# Patient Record
Sex: Female | Born: 1965 | Race: White | Hispanic: No | Marital: Married | Smoking: Former smoker
Health system: Southern US, Community
[De-identification: ages and names within clinical notes are randomized; demographics above are authoritative.]

---

## 2013-09-13 ENCOUNTER — Other Ambulatory Visit (HOSPITAL_COMMUNITY): Payer: Self-pay | Admitting: Family Medicine

## 2013-09-13 DIAGNOSIS — Z139 Encounter for screening, unspecified: Secondary | ICD-10-CM

## 2013-09-19 ENCOUNTER — Ambulatory Visit (HOSPITAL_COMMUNITY)
Admission: RE | Admit: 2013-09-19 | Discharge: 2013-09-19 | Disposition: A | Discharge status: Home / Self Care | Payer: Managed Care, Other (non HMO) | Source: Ambulatory Visit | Attending: Family Medicine | Admitting: Family Medicine

## 2013-09-19 DIAGNOSIS — Z139 Encounter for screening, unspecified: Secondary | ICD-10-CM

## 2013-09-19 DIAGNOSIS — Z1231 Encounter for screening mammogram for malignant neoplasm of breast: Secondary | ICD-10-CM | POA: Insufficient documentation

## 2015-10-22 ENCOUNTER — Other Ambulatory Visit (HOSPITAL_COMMUNITY): Payer: Self-pay | Admitting: Unknown Physician Specialty

## 2015-10-22 DIAGNOSIS — Z1231 Encounter for screening mammogram for malignant neoplasm of breast: Secondary | ICD-10-CM

## 2015-10-28 ENCOUNTER — Ambulatory Visit (HOSPITAL_COMMUNITY)
Admission: RE | Admit: 2015-10-28 | Discharge: 2015-10-28 | Disposition: A | Discharge status: Home / Self Care | Payer: Managed Care, Other (non HMO) | Source: Ambulatory Visit | Attending: Unknown Physician Specialty | Admitting: Unknown Physician Specialty

## 2015-10-28 DIAGNOSIS — Z1231 Encounter for screening mammogram for malignant neoplasm of breast: Secondary | ICD-10-CM | POA: Diagnosis not present

## 2017-01-14 ENCOUNTER — Other Ambulatory Visit (HOSPITAL_COMMUNITY): Payer: Self-pay | Admitting: Family Medicine

## 2017-01-14 DIAGNOSIS — Z1231 Encounter for screening mammogram for malignant neoplasm of breast: Secondary | ICD-10-CM

## 2017-01-21 ENCOUNTER — Ambulatory Visit (HOSPITAL_COMMUNITY)
Admission: RE | Admit: 2017-01-21 | Discharge: 2017-01-21 | Disposition: A | Discharge status: Home / Self Care | Payer: Managed Care, Other (non HMO) | Source: Ambulatory Visit | Attending: Family Medicine | Admitting: Family Medicine

## 2017-01-21 DIAGNOSIS — Z1231 Encounter for screening mammogram for malignant neoplasm of breast: Secondary | ICD-10-CM | POA: Insufficient documentation

## 2017-02-04 ENCOUNTER — Encounter (INDEPENDENT_AMBULATORY_CARE_PROVIDER_SITE_OTHER): Payer: Self-pay | Admitting: *Deleted

## 2017-02-19 ENCOUNTER — Other Ambulatory Visit (INDEPENDENT_AMBULATORY_CARE_PROVIDER_SITE_OTHER): Payer: Self-pay | Admitting: *Deleted

## 2017-02-19 DIAGNOSIS — Z1211 Encounter for screening for malignant neoplasm of colon: Secondary | ICD-10-CM | POA: Insufficient documentation

## 2017-05-11 ENCOUNTER — Encounter (INDEPENDENT_AMBULATORY_CARE_PROVIDER_SITE_OTHER): Payer: Self-pay | Admitting: *Deleted

## 2017-05-11 ENCOUNTER — Telehealth (INDEPENDENT_AMBULATORY_CARE_PROVIDER_SITE_OTHER): Payer: Self-pay | Admitting: *Deleted

## 2017-05-11 MED ORDER — PEG 3350-KCL-NA BICARB-NACL 420 G PO SOLR: 4000.0000 mL | Freq: Once | ORAL | 0 refills | Status: AC

## 2017-05-11 NOTE — Telephone Encounter (Signed)
Patient needs trilyte 

## 2017-05-18 ENCOUNTER — Telehealth (INDEPENDENT_AMBULATORY_CARE_PROVIDER_SITE_OTHER): Payer: Self-pay | Admitting: *Deleted

## 2017-05-18 NOTE — Telephone Encounter (Signed)
agree

## 2017-05-18 NOTE — Telephone Encounter (Signed)
Referring MD/PCP: burdine   Procedure: tcs  Reason/Indication:  screening  Has patient had this procedure before?  no  If so, when, by whom and where?    Is there a family history of colon cancer?  no  Who?  What age when diagnosed?    Is patient diabetic?   no      Does patient have prosthetic heart valve or mechanical valve?  no  Do you have a pacemaker?  no  Has patient ever had endocarditis? no  Has patient had joint replacement within last 12 months?  no  Does patient tend to be constipated or take laxatives? no  Does patient have a history of alcohol/drug use?  no  Is patient on Coumadin, Plavix and/or Aspirin? no  Medications: none  Allergies: nkda  Medication Adjustment per Dr Karilyn Cotaehman:   Procedure date & time: 06/17/17 at 830

## 2017-06-17 ENCOUNTER — Ambulatory Visit (HOSPITAL_COMMUNITY)
Admission: RE | Admit: 2017-06-17 | Discharge: 2017-06-17 | Disposition: A | Discharge status: Home / Self Care | Payer: Managed Care, Other (non HMO) | Source: Ambulatory Visit | Attending: Internal Medicine | Admitting: Internal Medicine

## 2017-06-17 ENCOUNTER — Encounter (HOSPITAL_COMMUNITY): Payer: Self-pay | Admitting: *Deleted

## 2017-06-17 ENCOUNTER — Encounter (HOSPITAL_COMMUNITY)
Admission: RE | Disposition: A | Discharge status: Home / Self Care | Payer: Self-pay | Source: Ambulatory Visit | Attending: Internal Medicine

## 2017-06-17 DIAGNOSIS — Z79899 Other long term (current) drug therapy: Secondary | ICD-10-CM | POA: Diagnosis not present

## 2017-06-17 DIAGNOSIS — Z87891 Personal history of nicotine dependence: Secondary | ICD-10-CM | POA: Insufficient documentation

## 2017-06-17 DIAGNOSIS — Z1211 Encounter for screening for malignant neoplasm of colon: Secondary | ICD-10-CM | POA: Diagnosis not present

## 2017-06-17 DIAGNOSIS — K644 Residual hemorrhoidal skin tags: Secondary | ICD-10-CM

## 2017-06-17 DIAGNOSIS — K5730 Diverticulosis of large intestine without perforation or abscess without bleeding: Secondary | ICD-10-CM | POA: Diagnosis not present

## 2017-06-17 HISTORY — PX: COLONOSCOPY: SHX5424

## 2017-06-17 SURGERY — COLONOSCOPY
Anesthesia: Moderate Sedation

## 2017-06-17 MED ORDER — SODIUM CHLORIDE 0.9 % IV SOLN
INTRAVENOUS | Status: DC
  Administered 2017-06-17: 1000 mL via INTRAVENOUS

## 2017-06-17 MED ORDER — MIDAZOLAM HCL 5 MG/5ML IJ SOLN
INTRAMUSCULAR | Status: DC | PRN
  Administered 2017-06-17 (×4): 2 mg via INTRAVENOUS

## 2017-06-17 MED ORDER — MEPERIDINE HCL 50 MG/ML IJ SOLN
INTRAMUSCULAR | Status: AC
  Filled 2017-06-17: qty 1

## 2017-06-17 MED ORDER — SIMETHICONE 40 MG/0.6ML PO SUSP
ORAL | Status: AC
  Filled 2017-06-17: qty 30

## 2017-06-17 MED ORDER — MIDAZOLAM HCL 5 MG/5ML IJ SOLN
INTRAMUSCULAR | Status: AC
  Filled 2017-06-17: qty 10

## 2017-06-17 MED ORDER — MEPERIDINE HCL 50 MG/ML IJ SOLN
INTRAMUSCULAR | Status: DC | PRN
  Administered 2017-06-17 (×2): 25 mg via INTRAVENOUS

## 2017-06-17 NOTE — Discharge Instructions (Signed)
Resume usual medications and high fiber diet. No driving for 24 hours. Next screening exam in 10 years.    High-Fiber Diet Fiber, also called dietary fiber, is a type of carbohydrate found in fruits, vegetables, whole grains, and beans. A high-fiber diet can have many health benefits. Your health care provider may recommend a high-fiber diet to help:  Prevent constipation. Fiber can make your bowel movements more regular.  Lower your cholesterol.  Relieve hemorrhoids, uncomplicated diverticulosis, or irritable bowel syndrome.  Prevent overeating as part of a weight-loss plan.  Prevent heart disease, type 2 diabetes, and certain cancers.  What is my plan? The recommended daily intake of fiber includes:  38 grams for men under age 45.  30 grams for men over age 38.  25 grams for women under age 68.  21 grams for women over age 63.  You can get the recommended daily intake of dietary fiber by eating a variety of fruits, vegetables, grains, and beans. Your health care provider may also recommend a fiber supplement if it is not possible to get enough fiber through your diet. What do I need to know about a high-fiber diet?  Fiber supplements have not been widely studied for their effectiveness, so it is better to get fiber through food sources.  Always check the fiber content on thenutrition facts label of any prepackaged food. Look for foods that contain at least 5 grams of fiber per serving.  Ask your dietitian if you have questions about specific foods that are related to your condition, especially if those foods are not listed in the following section.  Increase your daily fiber consumption gradually. Increasing your intake of dietary fiber too quickly may cause bloating, cramping, or gas.  Drink plenty of water. Water helps you to digest fiber. What foods can I eat? Grains Whole-grain breads. Multigrain cereal. Oats and oatmeal. Brown rice. Barley. Bulgur wheat. Millet.  Bran muffins. Popcorn. Rye wafer crackers. Vegetables Sweet potatoes. Spinach. Kale. Artichokes. Cabbage. Broccoli. Green peas. Carrots. Squash. Fruits Berries. Pears. Apples. Oranges. Avocados. Prunes and raisins. Dried figs. Meats and Other Protein Sources Navy, kidney, pinto, and soy beans. Split peas. Lentils. Nuts and seeds. Dairy Fiber-fortified yogurt. Beverages Fiber-fortified soy milk. Fiber-fortified orange juice. Other Fiber bars. The items listed above may not be a complete list of recommended foods or beverages. Contact your dietitian for more options. What foods are not recommended? Grains White bread. Pasta made with refined flour. White rice. Vegetables Fried potatoes. Canned vegetables. Well-cooked vegetables. Fruits Fruit juice. Cooked, strained fruit. Meats and Other Protein Sources Fatty cuts of meat. Fried Environmental education officer or fried fish. Dairy Milk. Yogurt. Cream cheese. Sour cream. Beverages Soft drinks. Other Cakes and pastries. Butter and oils. The items listed above may not be a complete list of foods and beverages to avoid. Contact your dietitian for more information. What are some tips for including high-fiber foods in my diet?  Eat a wide variety of high-fiber foods.  Make sure that half of all grains consumed each day are whole grains.  Replace breads and cereals made from refined flour or white flour with whole-grain breads and cereals.  Replace white rice with brown rice, bulgur wheat, or millet.  Start the day with a breakfast that is high in fiber, such as a cereal that contains at least 5 grams of fiber per serving.  Use beans in place of meat in soups, salads, or pasta.  Eat high-fiber snacks, such as berries, raw vegetables, nuts, or popcorn.  This information is not intended to replace advice given to you by your health care provider. Make sure you discuss any questions you have with your health care provider. Document Released: 11/23/2005  Document Revised: 04/30/2016 Document Reviewed: 05/08/2014 Elsevier Interactive Patient Education  2017 Elsevier Inc.  Colonoscopy, Adult, Care After This sheet gives you information about how to care for yourself after your procedure. Your health care provider may also give you more specific instructions. If you have problems or questions, contact your health care provider. What can I expect after the procedure? After the procedure, it is common to have:  A small amount of blood in your stool for 24 hours after the procedure.  Some gas.  Mild abdominal cramping or bloating.  Follow these instructions at home: General instructions   For the first 24 hours after the procedure: ? Do not drive or use machinery. ? Do not sign important documents. ? Do not drink alcohol. ? Do your regular daily activities at a slower pace than normal. ? Eat soft, easy-to-digest foods. ? Rest often.  Take over-the-counter or prescription medicines only as told by your health care provider.  It is up to you to get the results of your procedure. Ask your health care provider, or the department performing the procedure, when your results will be ready. Relieving cramping and bloating  Try walking around when you have cramps or feel bloated.  Apply heat to your abdomen as told by your health care provider. Use a heat source that your health care provider recommends, such as a moist heat pack or a heating pad. ? Place a towel between your skin and the heat source. ? Leave the heat on for 20-30 minutes. ? Remove the heat if your skin turns bright red. This is especially important if you are unable to feel pain, heat, or cold. You may have a greater risk of getting burned. Eating and drinking  Drink enough fluid to keep your urine clear or pale yellow.  Resume your normal diet as instructed by your health care provider. Avoid heavy or fried foods that are hard to digest.  Avoid drinking alcohol for as  long as instructed by your health care provider. Contact a health care provider if:  You have blood in your stool 2-3 days after the procedure. Get help right away if:  You have more than a small spotting of blood in your stool.  You pass large blood clots in your stool.  Your abdomen is swollen.  You have nausea or vomiting.  You have a fever.  You have increasing abdominal pain that is not relieved with medicine. This information is not intended to replace advice given to you by your health care provider. Make sure you discuss any questions you have with your health care provider. Document Released: 07/07/2004 Document Revised: 08/17/2016 Document Reviewed: 02/04/2016 Elsevier Interactive Patient Education  [employer]2018 Elsevier Inc.

## 2017-06-17 NOTE — Op Note (Signed)
[facility] Patient Name: Cassidy Gray Procedure Date: 06/17/2017 8:13 AM MRN: [id number] Date of Birth: 07/17/1966 Attending MD: Lionel December , MD CSN: [id number] Age: 51 Admit Type: Outpatient Procedure:                Colonoscopy Indications:              Screening for colorectal malignant neoplasm Providers:                Lionel December, MD, Jannett Celestine, RN, Nena Polio, RN Referring MD:             Juliette Alcide, MD Medicines:                Meperidine 50 mg IV, Midazolam 8 mg IV Complications:            No immediate complications. Estimated Blood Loss:     Estimated blood loss: none. Procedure:                Pre-Anesthesia Assessment:                           - Prior to the procedure, a History and Physical                            was performed, and patient medications and                            allergies were reviewed. The patient's tolerance of                            previous anesthesia was also reviewed. The risks                            and benefits of the procedure and the sedation                            options and risks were discussed with the patient.                            All questions were answered, and informed consent                            was obtained. Prior Anticoagulants: The patient                            last took naproxen 3 days prior to the procedure.                            ASA Grade Assessment: I - A normal, healthy                            patient. After reviewing the risks and benefits,                            the patient was deemed in satisfactory condition to  undergo the procedure.                           After obtaining informed consent, the colonoscope                            was passed under direct vision. Throughout the                            procedure, the patient's blood pressure, pulse, and                            oxygen saturations were monitored  continuously. The                            EC-[id number] ([id number]) scope was introduced through                            the anus and advanced to the the cecum, identified                            by appendiceal orifice and ileocecal valve. The                            colonoscopy was performed without difficulty. The                            patient tolerated the procedure well. The quality                            of the bowel preparation was excellent. The                            ileocecal valve, appendiceal orifice, and rectum                            were photographed. Scope In: 8:35:50 AM Scope Out: 8:56:58 AM Scope Withdrawal Time: 0 hours 6 minutes 15 seconds  Total Procedure Duration: 0 hours 21 minutes 8 seconds  Findings:      The perianal and digital rectal examinations were normal.      Multiple medium-mouthed diverticula were found in the entire colon.      The exam was otherwise normal throughout the examined colon.      External hemorrhoids were found during retroflexion. The hemorrhoids       were medium-sized. Impression:               - Diverticulosis in the entire examined colon. Most                            of the diverticula are located at sigmoid colon.                           - External hemorrhoids.                           -  No specimens collected. Moderate Sedation:      Moderate (conscious) sedation was administered by the endoscopy nurse       and supervised by the endoscopist. The following parameters were       monitored: oxygen saturation, heart rate, blood pressure, CO2       capnography and response to care. Total physician intraservice time was       29 minutes. Recommendation:           - Patient has a contact number available for                            emergencies. The signs and symptoms of potential                            delayed complications were discussed with the                            patient. Return to normal  activities tomorrow.                            Written discharge instructions were provided to the                            patient.                           - High fiber diet today.                           - Continue present medications.                           - Repeat colonoscopy in 10 years for screening                            purposes. Procedure Code(s):        --- Professional ---                           [phone], Colonoscopy, flexible; diagnostic, including                            collection of specimen(s) by brushing or washing,                            when performed (separate procedure)                           99152, Moderate sedation services provided by the                            same physician or other qualified health care                            professional performing the diagnostic or  therapeutic service that the sedation supports,                            requiring the presence of an independent trained                            observer to assist in the monitoring of the                            patient's level of consciousness and physiological                            status; initial 15 minutes of intraservice time,                            patient age 20 years or older                           [phone], Moderate sedation services; each additional                            15 minutes intraservice time Diagnosis Code(s):        --- Professional ---                           Z12.11, Encounter for screening for malignant                            neoplasm of colon                           K64.4, Residual hemorrhoidal skin tags                           K57.30, Diverticulosis of large intestine without                            perforation or abscess without bleeding CPT copyright 2016 American Medical Association. All rights reserved. The codes documented in this report are preliminary and upon coder review  may  be revised to meet current compliance requirements. Lionel DecemberNajeeb Rehman, MD Lionel DecemberNajeeb Rehman, MD 06/17/2017 9:04:20 AM This report has been signed electronically. Number of Addenda: 0

## 2017-06-17 NOTE — H&P (Signed)
Cassidy Gray is an 51 y.o. female.   Chief Complaint: Patient is here for colonoscopy. HPI: Patient is 51 year old Caucasian female who is here for screening colonoscopy. She denies abdominal pain change in bowel habits or rectal bleeding. Family history is negative for CRC.  History reviewed. No pertinent past medical history.  History reviewed. No pertinent surgical history.  Family History  Problem Relation Age of Onset  . Osteoporosis Mother   . Lung cancer Father   . Melanoma Brother    Social History:  reports that she has quit smoking. She has never used smokeless tobacco. She reports that she drinks alcohol. She reports that she does not use drugs.  Allergies: No Known Allergies  Medications Prior to Admission  Medication Sig Dispense Refill  . Cholecalciferol (VITAMIN D PO) Take 1 tablet by mouth daily.    . naproxen sodium (ANAPROX) 220 MG tablet Take 440 mg by mouth daily as needed (pain).      No results found for this or any previous visit (from the past 48 hour(s)). No results found.  ROS  Blood pressure 125/72, pulse 63, temperature 98.8 F (37.1 C), temperature source Oral, resp. rate 17, height 5\' 5"  (1.651 m), weight 207 lb (93.9 kg), last menstrual period 09/06/2013, SpO2 95 %. Physical Exam  Constitutional: She appears well-developed and well-nourished.  HENT:  Mouth/Throat: Oropharynx is clear and moist.  Eyes: Conjunctivae are normal. No scleral icterus.  Neck: No thyromegaly present.  Cardiovascular: Normal rate, regular rhythm and normal heart sounds.   No murmur heard. Respiratory: Effort normal and breath sounds normal.  GI: Soft. She exhibits no distension and no mass. There is no tenderness.  Musculoskeletal: She exhibits no edema.  Lymphadenopathy:    She has no cervical adenopathy.  Neurological: She is alert.  Skin: Skin is warm and dry.     Assessment/Plan Average risk screening colonoscopy.  Lionel DecemberNajeeb Rehman, MD 06/17/2017, 8:24  AM

## 2017-06-21 ENCOUNTER — Encounter (HOSPITAL_COMMUNITY): Payer: Self-pay | Admitting: Internal Medicine

## 2018-09-06 ENCOUNTER — Other Ambulatory Visit (HOSPITAL_COMMUNITY): Payer: Self-pay | Admitting: Family Medicine

## 2018-09-06 DIAGNOSIS — Z1231 Encounter for screening mammogram for malignant neoplasm of breast: Secondary | ICD-10-CM

## 2018-09-14 ENCOUNTER — Encounter (HOSPITAL_COMMUNITY): Payer: Self-pay

## 2018-09-14 ENCOUNTER — Ambulatory Visit (HOSPITAL_COMMUNITY)
Admission: RE | Admit: 2018-09-14 | Discharge: 2018-09-14 | Disposition: A | Discharge status: Home / Self Care | Payer: Managed Care, Other (non HMO) | Source: Ambulatory Visit | Attending: Family Medicine | Admitting: Family Medicine

## 2018-09-14 DIAGNOSIS — Z1231 Encounter for screening mammogram for malignant neoplasm of breast: Secondary | ICD-10-CM | POA: Diagnosis present

## 2019-10-18 ENCOUNTER — Other Ambulatory Visit (HOSPITAL_COMMUNITY): Payer: Self-pay | Admitting: Family Medicine

## 2019-10-18 DIAGNOSIS — Z1231 Encounter for screening mammogram for malignant neoplasm of breast: Secondary | ICD-10-CM

## 2019-10-28 IMAGING — MG DIGITAL SCREENING BILATERAL MAMMOGRAM WITH TOMO AND CAD
6 of 11 series · 6 of 31 positions shown · non-contrast
Comparison: Previous exam(s).

CLINICAL DATA: Screening.

EXAM:
DIGITAL SCREENING BILATERAL MAMMOGRAM WITH TOMO AND CAD

[R CC]
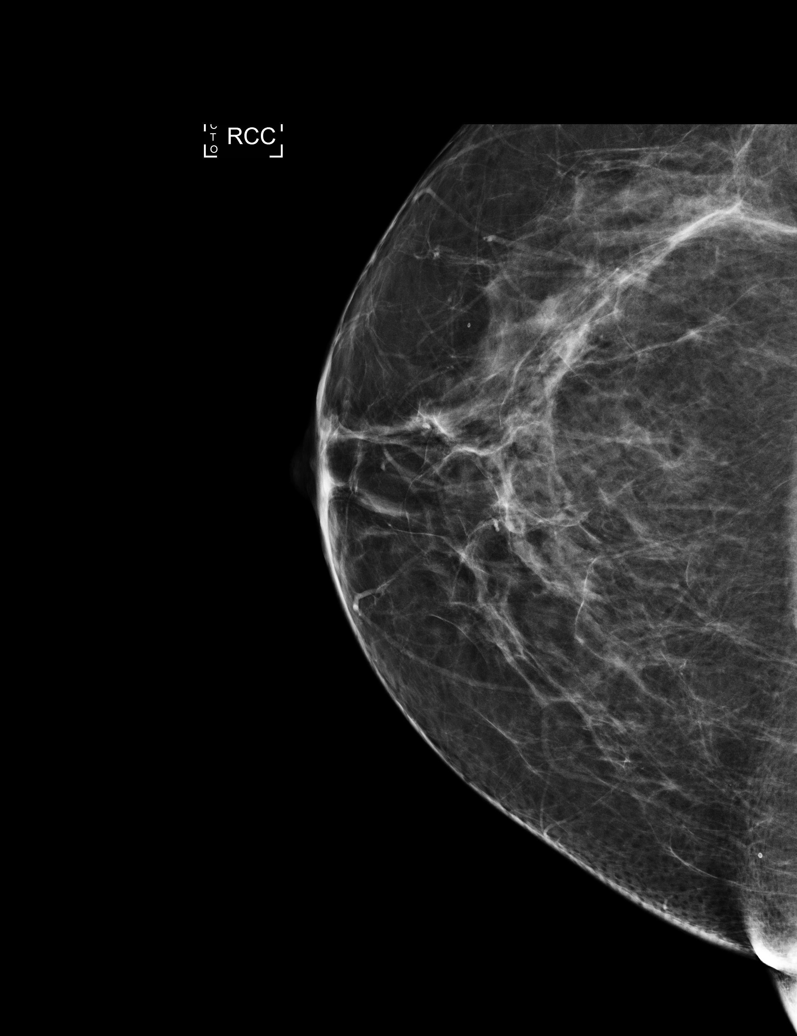

[R CC synth-2D]
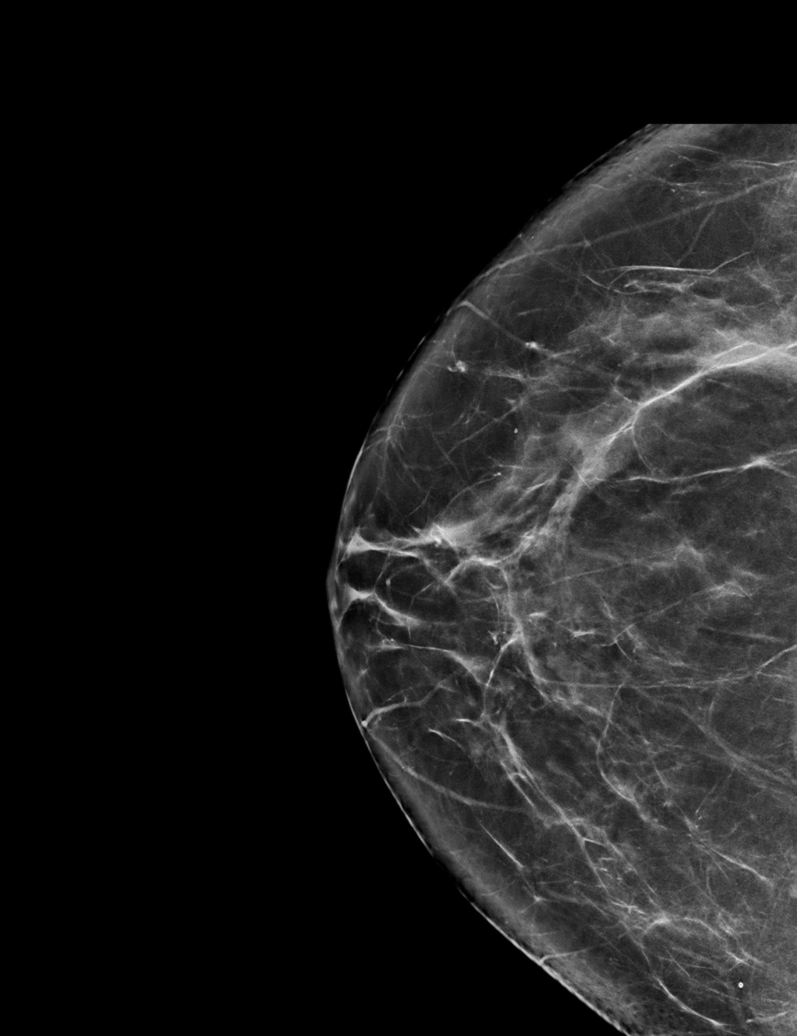

[R MLO synth-2D (1 of 2)]
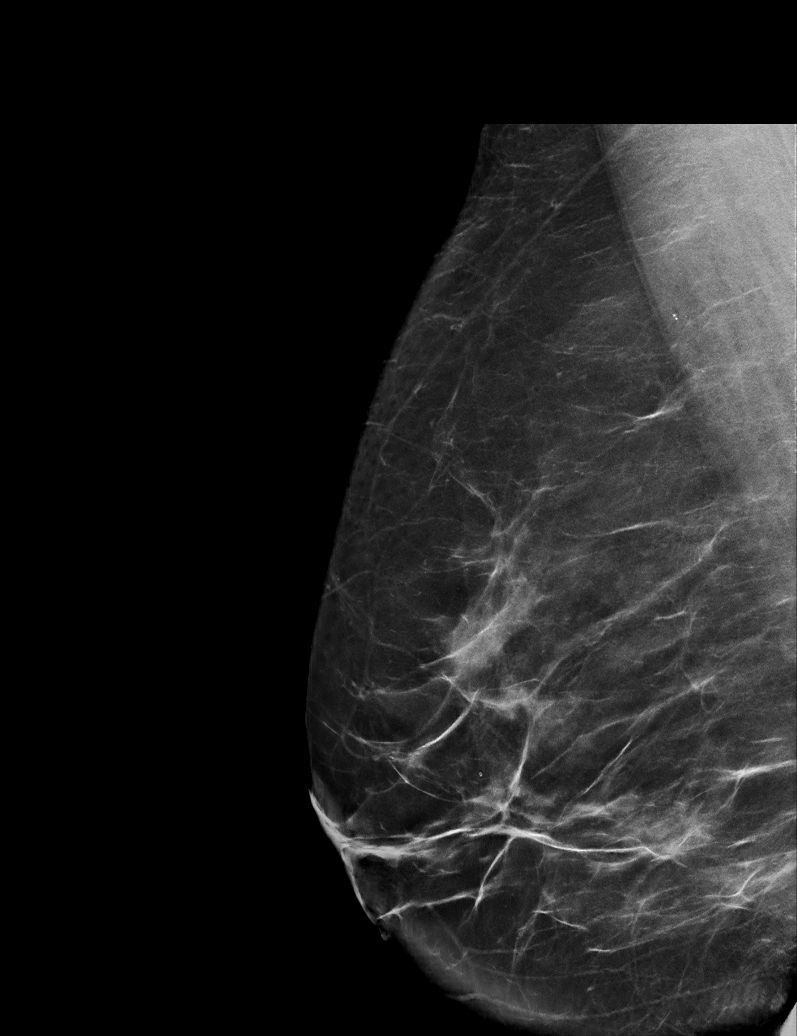

[R MLO synth-2D (2 of 2)]
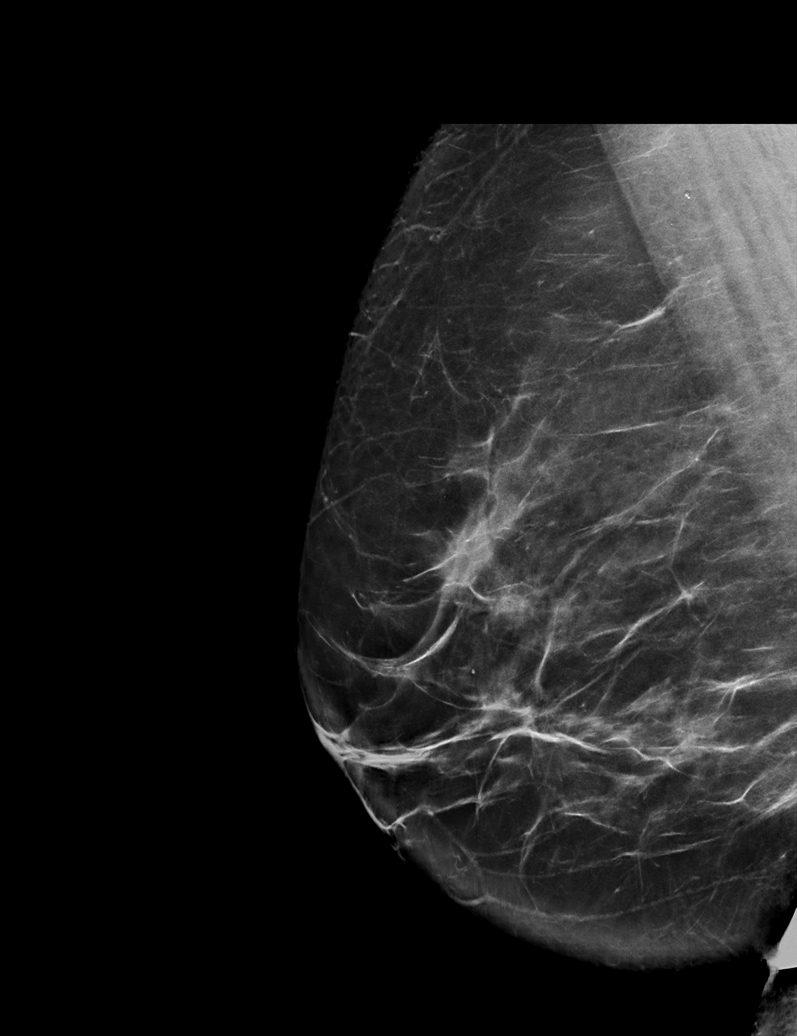

[L MLO synth-2D]
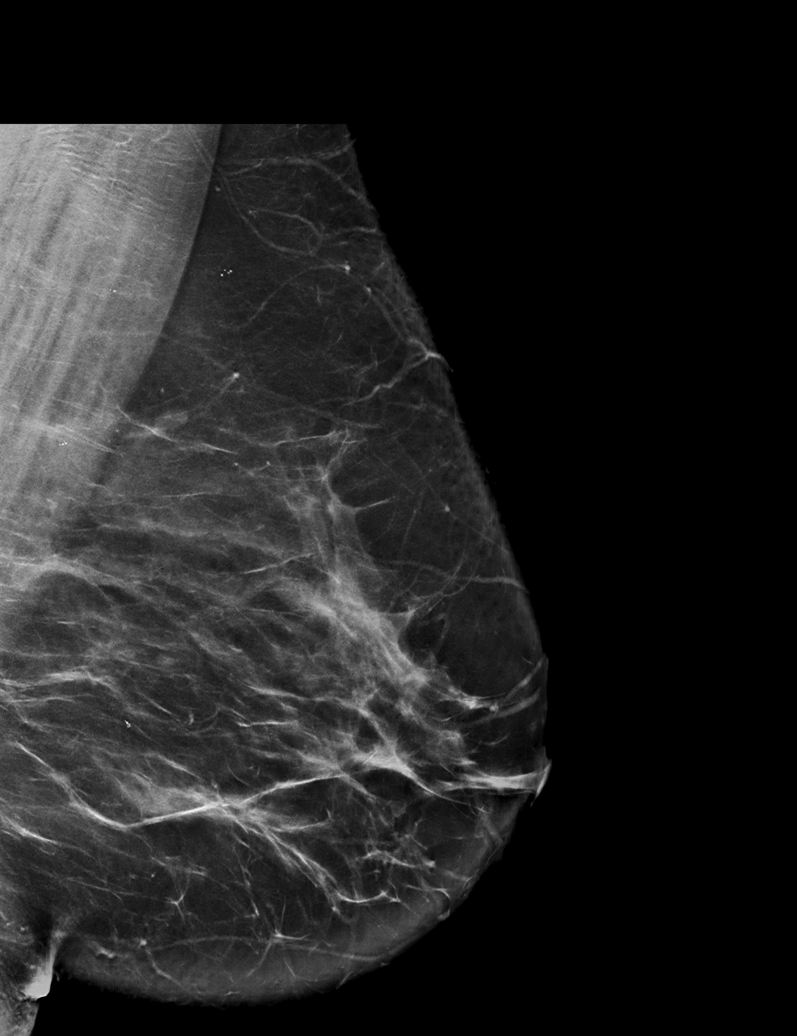

[L CC synth-2D]
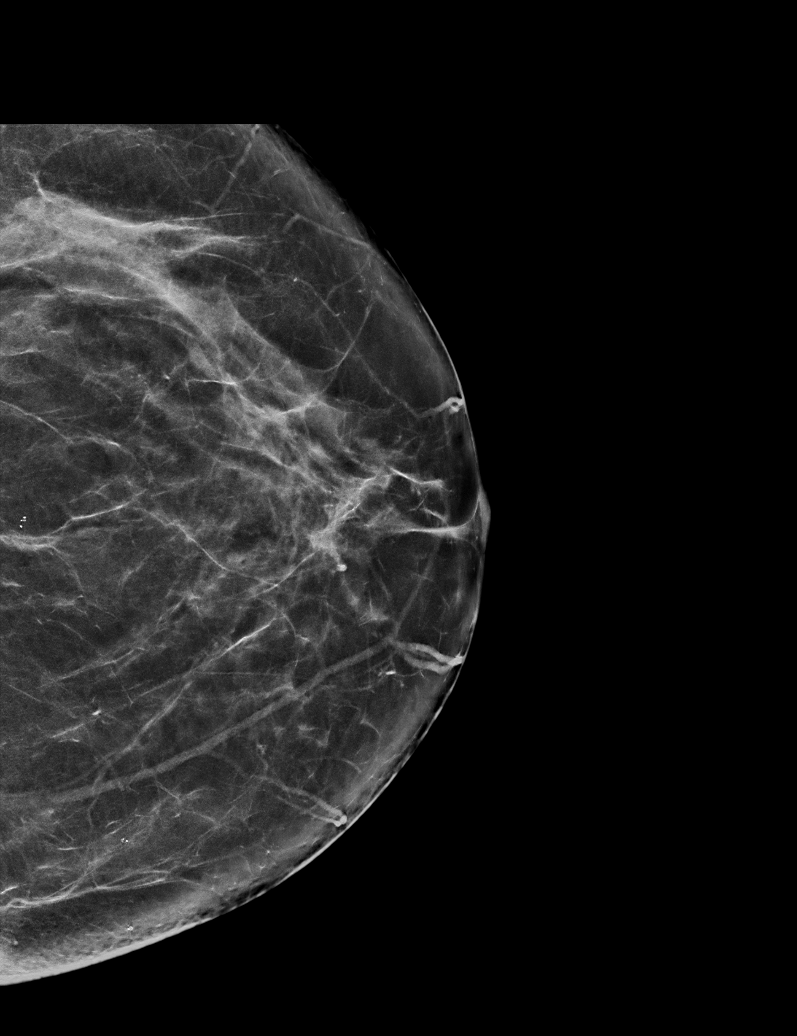

[6 of 31 positions shown; findings below may reference images not displayed]

ACR Breast Density Category b: There are scattered areas of
fibroglandular density.
FINDINGS: There are no findings suspicious for malignancy. Images were
processed with CAD.
IMPRESSION: No mammographic evidence of malignancy. A result letter of this
screening mammogram will be mailed directly to the patient.

RECOMMENDATION:
Screening mammogram in one year. (Code:CN-U-775)

BI-RADS CATEGORY  1: Negative.

## 2019-11-10 ENCOUNTER — Ambulatory Visit (HOSPITAL_COMMUNITY)
Admission: RE | Admit: 2019-11-10 | Discharge: 2019-11-10 | Disposition: A | Discharge status: Home / Self Care | Payer: Managed Care, Other (non HMO) | Source: Ambulatory Visit | Attending: Family Medicine | Admitting: Family Medicine

## 2019-11-10 ENCOUNTER — Other Ambulatory Visit: Payer: Self-pay

## 2019-11-10 DIAGNOSIS — Z1231 Encounter for screening mammogram for malignant neoplasm of breast: Secondary | ICD-10-CM | POA: Diagnosis not present

## 2019-11-22 ENCOUNTER — Ambulatory Visit (INDEPENDENT_AMBULATORY_CARE_PROVIDER_SITE_OTHER): Payer: Managed Care, Other (non HMO) | Admitting: Orthopaedic Surgery

## 2019-11-22 ENCOUNTER — Ambulatory Visit (INDEPENDENT_AMBULATORY_CARE_PROVIDER_SITE_OTHER): Payer: Managed Care, Other (non HMO)

## 2019-11-22 ENCOUNTER — Encounter: Payer: Self-pay | Admitting: Orthopaedic Surgery

## 2019-11-22 VITALS — Ht 65.0 in | Wt 207.0 lb

## 2019-11-22 DIAGNOSIS — M79672 Pain in left foot: Secondary | ICD-10-CM | POA: Diagnosis not present

## 2019-11-22 NOTE — Progress Notes (Signed)
Office Visit Note   Patient: Cassidy Gray           Date of Birth: [birth year]           MRN: [id number] Visit Date: 11/22/2019              Requested by: Juliette Alcide, MD [address] [place],  [place] [address] PCP: Juliette Alcide, MD   Assessment & Plan: Visit Diagnoses:  1. Pain in left foot     Plan: Dorsal midfoot left foot contusion without evidence of fracture.  Symptomatic treatment.  Return as needed  Follow-Up Instructions: Return if symptoms worsen or fail to improve.   Orders:  Orders Placed This Encounter  Procedures  . XR Foot Complete Left   No orders of the defined types were placed in this encounter.     Procedures: No procedures performed   Clinical Data: No additional findings.   Subjective: Chief Complaint  Patient presents with  . Left Foot - Pain    DOI 11/21/2019  Patient presents today with left foot pain. She injured her foot last night when she walking at home and struck her foot on a wooden rocking horse. She had immediate pain across the top of her foot, but has improved today. She also has swelling and discoloration.  Able to bear weight today but not last night.  Mild ecchymosis in the dorsum of her foot but no numbness or tingling.  HPI  Review of Systems   Objective: Vital Signs: Ht 5\' 5"  (1.651 m)   Wt 207 lb (93.9 kg)   LMP 09/06/2013   BMI 34.45 kg/m   Physical Exam Constitutional:      Appearance: She is well-developed.  Eyes:     Pupils: Pupils are equal, round, and reactive to light.  Pulmonary:     Effort: Pulmonary effort is normal.  Skin:    General: Skin is warm and dry.  Neurological:     Mental Status: She is alert and oriented to person, place, and time.  Psychiatric:        Behavior: Behavior normal.     Ortho Exam left foot with very minimal dorsal edema.  Early ecchymosis in the midfoot and along the dorsal metatarsals.  Toes not swollen.  Neurologically intact.  Skin intact.  No hindfoot  discomfort.  No ankle pain.  Very minimal discomfort along the dorsum of her midfoot crepitation  Specialty Comments:  No specialty comments available.  Imaging: XR Foot Complete Left  Result Date: 11/22/2019 Films of the left foot were obtained in 3 projections.  Injury occurred to the midfoot.  Films did not reveal any acute changes or evidence of a fracture or subluxation small posterior and plantar heel spurs and some mild degenerative changes in the midfoot    PMFS History: Patient Active Problem List   Diagnosis Date Noted  . Pain in left foot 11/22/2019  . Special screening for malignant neoplasms, colon 02/19/2017   History reviewed. No pertinent past medical history.  Family History  Problem Relation Age of Onset  . Osteoporosis Mother   . Lung cancer Father   . Melanoma Brother     Past Surgical History:  Procedure Laterality Date  . COLONOSCOPY N/A 06/17/2017   Procedure: COLONOSCOPY;  Surgeon: 08/18/2017, MD;  Location: [facility];  Service: Endoscopy;  Laterality: N/A;  830   Social History   Occupational History  . Not on file  Tobacco Use  .  Smoking status: Former Research scientist (life sciences)  . Smokeless tobacco: Never Used  . Tobacco comment: quit 2011  Substance and Sexual Activity  . Alcohol use: Yes    Comment: couple of beers 2-3 times per week  . Drug use: No  . Sexual activity: Not on file

## 2020-12-11 ENCOUNTER — Other Ambulatory Visit (HOSPITAL_COMMUNITY): Payer: Self-pay | Admitting: Family Medicine

## 2020-12-11 DIAGNOSIS — Z1231 Encounter for screening mammogram for malignant neoplasm of breast: Secondary | ICD-10-CM

## 2020-12-25 ENCOUNTER — Ambulatory Visit (HOSPITAL_COMMUNITY): Payer: Managed Care, Other (non HMO)

## 2021-01-02 ENCOUNTER — Other Ambulatory Visit: Payer: Self-pay

## 2021-01-02 ENCOUNTER — Ambulatory Visit (HOSPITAL_COMMUNITY)
Admission: RE | Admit: 2021-01-02 | Discharge: 2021-01-02 | Disposition: A | Discharge status: Home / Self Care | Payer: BLUE CROSS/BLUE SHIELD | Source: Ambulatory Visit | Attending: Family Medicine | Admitting: Family Medicine

## 2021-01-02 DIAGNOSIS — Z1231 Encounter for screening mammogram for malignant neoplasm of breast: Secondary | ICD-10-CM | POA: Diagnosis not present

## 2021-04-17 ENCOUNTER — Other Ambulatory Visit: Payer: Self-pay

## 2021-04-17 ENCOUNTER — Ambulatory Visit (INDEPENDENT_AMBULATORY_CARE_PROVIDER_SITE_OTHER): Payer: BC Managed Care – PPO | Admitting: General Surgery

## 2021-04-17 ENCOUNTER — Encounter: Payer: Self-pay | Admitting: General Surgery

## 2021-04-17 VITALS — BP 166/95 | HR 74 | Temp 98.3°F | Resp 16 | Ht 65.0 in | Wt 238.0 lb

## 2021-04-17 DIAGNOSIS — K8020 Calculus of gallbladder without cholecystitis without obstruction: Secondary | ICD-10-CM | POA: Insufficient documentation

## 2021-04-17 NOTE — Progress Notes (Signed)
[facility] History and Physical  Reason for Referral: Gallstones  Referring Physician: Juliette Alcide, MD   Chief Complaint    New Patient (Initial Visit)      Cassidy Gray is a 55 y.o. female.  HPI: Cassidy Gray started having epigastric/ RUQ pain and associated nausea and feeling sick about 1 year ago. She says her first attack was pretty bad and happened at night. She says since then she had 1 attack 6 months ago and now monthly. This has been related to eating greasy foods and red meat. She says she has had some changes in her BM to having more diarrhea.  She takes some naproxen but only 1 time per week if that. She is otherwise healthy and had no surgeries. She works for [employer].   No past medical history on file.  Past Surgical History:  Procedure Laterality Date  . COLONOSCOPY N/A 06/17/2017   Procedure: COLONOSCOPY;  Surgeon: Malissa Hippo, MD;  Location: [facility];  Service: Endoscopy;  Laterality: N/A;  830    Family History  Problem Relation Age of Onset  . Osteoporosis Mother   . Lung cancer Father   . Melanoma Brother     Social History   Tobacco Use  . Smoking status: Former Games developer  . Smokeless tobacco: Never Used  . Tobacco comment: quit 2011  Vaping Use  . Vaping Use: Never used  Substance Use Topics  . Alcohol use: Yes    Comment: couple of beers 2-3 times per week  . Drug use: No    Medications: I have reviewed the patient's current medications. Allergies as of 04/17/2021   No Known Allergies     Medication List       Accurate as of Apr 17, 2021  2:58 PM. If you have any questions, ask your nurse or doctor.        STOP taking these medications   naproxen sodium 220 MG tablet Commonly known as: ALEVE Stopped by: Lucretia Roers, MD   VITAMIN D PO Stopped by: Lucretia Roers, MD     TAKE these medications   multivitamin capsule Take 1 capsule by mouth daily.        ROS:  A comprehensive review of  systems was negative except for: Gastrointestinal: positive for abdominal pain Musculoskeletal: positive for joint pain  Blood pressure (!) 166/95, pulse 74, temperature 98.3 F (36.8 C), temperature source Other (Comment), resp. rate 16, height 5\' 5"  (1.651 m), weight 238 lb (108 kg), last menstrual period 09/06/2013, SpO2 95 %. Physical Exam Vitals reviewed.  Constitutional:      Appearance: Normal appearance.  HENT:     Head: Normocephalic.     Nose: Nose normal.  Eyes:     Extraocular Movements: Extraocular movements intact.  Cardiovascular:     Rate and Rhythm: Normal rate and regular rhythm.  Pulmonary:     Effort: Pulmonary effort is normal.     Breath sounds: Normal breath sounds.  Abdominal:     General: There is no distension.     Palpations: Abdomen is soft.     Tenderness: There is abdominal tenderness in the right upper quadrant and epigastric area.  Musculoskeletal:        General: Normal range of motion.     Cervical back: Normal range of motion.  Skin:    General: Skin is warm.  Neurological:     General: No focal deficit present.     Mental Status:  She is alert and oriented to person, place, and time.  Psychiatric:        Mood and Affect: Mood normal.        Behavior: Behavior normal.        Thought Content: Thought content normal.     Results: CLINICAL DATA: Abdominal pain, nausea for 1 year   EXAM:  ABDOMEN ULTRASOUND COMPLETE   COMPARISON: None.   FINDINGS:  Gallbladder: Multiple echogenic, shadowing stones within the  gallbladder lumen, largest measuring approximately 2.0 cm in  diameter. No pericholecystic fluid or wall thickening visualized. No  sonographic Murphy sign noted by sonographer.   Common bile duct: Diameter: 4 mm.   Liver: No focal lesion identified. Diffusely increased hepatic  parenchymal echogenicity. Portal vein is patent on color Doppler  imaging with normal direction of blood flow towards the liver.   IVC: No  abnormality visualized.   Pancreas: Visualized portion unremarkable.   Spleen: Size and appearance within normal limits.   Right Kidney: Length: 11.5 cm. Echogenicity within normal limits. No  mass or hydronephrosis visualized.   Left Kidney: Length: 11.8 cm. Echogenicity within normal limits. No  mass or hydronephrosis visualized.   Abdominal aorta: No aneurysm visualized.   Other findings: None.   IMPRESSION:  1. Cholelithiasis without sonographic evidence of acute  cholecystitis.  2. The echogenicity of the liver is increased. This is a nonspecific  finding but is most commonly seen with fatty infiltration of the  liver. There are no obvious focal liver lesions.    Electronically Signed   By: Duanne Guess D.O.   On: 01/07/2021 15:12   Assessment & Plan:  Cassidy Gray is a 55 y.o. female with gallstones that are symptomatic. She is working more in the office at work now and wants to go ahead and get the surgery done before things pick up at [employer].   PLAN: I counseled the patient about the indication, risks and benefits of laparoscopic cholecystectomy.  She understands there is a very small chance for bleeding, infection, injury to normal structures (including common bile duct), conversion to open surgery, persistent symptoms, evolution of postcholecystectomy diarrhea, need for secondary interventions, anesthesia reaction, cardiopulmonary issues and other risks not specifically detailed here. I described the expected recovery, the plan for follow-up and the restrictions during the recovery phase.  All questions were answered.  Discussed preop COVID testing.   All questions were answered to the satisfaction of the patient.    Lucretia Roers 04/17/2021, 2:58 PM

## 2021-04-17 NOTE — H&P (Signed)
[facility] History and Physical  Reason for Referral: Gallstones  Referring Physician: Burdine, Steven E, MD   Chief Complaint    New Patient (Initial Visit)      Cassidy Gray is a 55 y.o. female.  HPI: Cassidy Gray started having epigastric/ RUQ pain and associated nausea and feeling sick about 1 year ago. She says her first attack was pretty bad and happened at night. She says since then she had 1 attack 6 months ago and now monthly. This has been related to eating greasy foods and red meat. She says she has had some changes in her BM to having more diarrhea.  She takes some naproxen but only 1 time per week if that. She is otherwise healthy and had no surgeries. She works for [employer].   No past medical history on file.  Past Surgical History:  Procedure Laterality Date  . COLONOSCOPY N/A 06/17/2017   Procedure: COLONOSCOPY;  Surgeon: Rehman, Najeeb U, MD;  Location: [facility];  Service: Endoscopy;  Laterality: N/A;  830    Family History  Problem Relation Age of Onset  . Osteoporosis Mother   . Lung cancer Father   . Melanoma Brother     Social History   Tobacco Use  . Smoking status: Former Smoker  . Smokeless tobacco: Never Used  . Tobacco comment: quit 2011  Vaping Use  . Vaping Use: Never used  Substance Use Topics  . Alcohol use: Yes    Comment: couple of beers 2-3 times per week  . Drug use: No    Medications: I have reviewed the patient's current medications. Allergies as of 04/17/2021   No Known Allergies     Medication List       Accurate as of Apr 17, 2021  2:58 PM. If you have any questions, ask your nurse or doctor.        STOP taking these medications   naproxen sodium 220 MG tablet Commonly known as: ALEVE Stopped by: Trini Christiansen C Abiha Lukehart, MD   VITAMIN D PO Stopped by: Diania Co C Lorrayne Ismael, MD     TAKE these medications   multivitamin capsule Take 1 capsule by mouth daily.        ROS:  A comprehensive review of  systems was negative except for: Gastrointestinal: positive for abdominal pain Musculoskeletal: positive for joint pain  Blood pressure (!) 166/95, pulse 74, temperature 98.3 F (36.8 C), temperature source Other (Comment), resp. rate 16, height 5' 5" (1.651 m), weight 238 lb (108 kg), last menstrual period 09/06/2013, SpO2 95 %. Physical Exam Vitals reviewed.  Constitutional:      Appearance: Normal appearance.  HENT:     Head: Normocephalic.     Nose: Nose normal.  Eyes:     Extraocular Movements: Extraocular movements intact.  Cardiovascular:     Rate and Rhythm: Normal rate and regular rhythm.  Pulmonary:     Effort: Pulmonary effort is normal.     Breath sounds: Normal breath sounds.  Abdominal:     General: There is no distension.     Palpations: Abdomen is soft.     Tenderness: There is abdominal tenderness in the right upper quadrant and epigastric area.  Musculoskeletal:        General: Normal range of motion.     Cervical back: Normal range of motion.  Skin:    General: Skin is warm.  Neurological:     General: No focal deficit present.     Mental Status:   She is alert and oriented to person, place, and time.  Psychiatric:        Mood and Affect: Mood normal.        Behavior: Behavior normal.        Thought Content: Thought content normal.     Results: CLINICAL DATA: Abdominal pain, nausea for 1 year   EXAM:  ABDOMEN ULTRASOUND COMPLETE   COMPARISON: None.   FINDINGS:  Gallbladder: Multiple echogenic, shadowing stones within the  gallbladder lumen, largest measuring approximately 2.0 cm in  diameter. No pericholecystic fluid or wall thickening visualized. No  sonographic Murphy sign noted by sonographer.   Common bile duct: Diameter: 4 mm.   Liver: No focal lesion identified. Diffusely increased hepatic  parenchymal echogenicity. Portal vein is patent on color Doppler  imaging with normal direction of blood flow towards the liver.   IVC: No  abnormality visualized.   Pancreas: Visualized portion unremarkable.   Spleen: Size and appearance within normal limits.   Right Kidney: Length: 11.5 cm. Echogenicity within normal limits. No  mass or hydronephrosis visualized.   Left Kidney: Length: 11.8 cm. Echogenicity within normal limits. No  mass or hydronephrosis visualized.   Abdominal aorta: No aneurysm visualized.   Other findings: None.   IMPRESSION:  1. Cholelithiasis without sonographic evidence of acute  cholecystitis.  2. The echogenicity of the liver is increased. This is a nonspecific  finding but is most commonly seen with fatty infiltration of the  liver. There are no obvious focal liver lesions.    Electronically Signed   By: Nicholas Plundo D.O.   On: 01/07/2021 15:12   Assessment & Plan:  Cassidy Gray is a 55 y.o. female with gallstones that are symptomatic. She is working more in the office at work now and wants to go ahead and get the surgery done before things pick up at [employer].   PLAN: I counseled the patient about the indication, risks and benefits of laparoscopic cholecystectomy.  She understands there is a very small chance for bleeding, infection, injury to normal structures (including common bile duct), conversion to open surgery, persistent symptoms, evolution of postcholecystectomy diarrhea, need for secondary interventions, anesthesia reaction, cardiopulmonary issues and other risks not specifically detailed here. I described the expected recovery, the plan for follow-up and the restrictions during the recovery phase.  All questions were answered.  Discussed preop COVID testing.   All questions were answered to the satisfaction of the patient.    Shanecia Hoganson C Lowen Barringer 04/17/2021, 2:58 PM       

## 2021-04-17 NOTE — Patient Instructions (Signed)
Minimally Invasive Cholecystectomy Minimally invasive cholecystectomy is surgery to remove the gallbladder. The gallbladder is a pear-shaped organ that lies beneath the liver on the right side of the body. The gallbladder stores bile, which is a fluid that helps the body digest fats. Cholecystectomy is often done to treat inflammation of the gallbladder (cholecystitis). This condition is usually caused by a buildup of gallstones (cholelithiasis) in the gallbladder. Gallstones can block the flow of bile, which can result in inflammation and pain. In severe cases, emergency surgery may be required. This procedure is done though small incisions in the abdomen, instead of one large incision. It is also called laparoscopic surgery. A thin scope with a camera (laparoscope) is inserted through one incision. Then surgical instruments are inserted through the other incisions. In some cases, a minimally invasive surgery may need to be changed to a surgery that is done through a larger incision. This is called open surgery. Tell a health care provider about:  Any allergies you have.  All medicines you are taking, including vitamins, herbs, eye drops, creams, and over-the-counter medicines.  Any problems you or family members have had with anesthetic medicines.  Any blood disorders you have.  Any surgeries you have had.  Any medical conditions you have.  Whether you are pregnant or may be pregnant. What are the risks? Generally, this is a safe procedure. However, problems may occur, including:  Infection.  Bleeding.  Allergic reactions to medicines.  Damage to nearby structures or organs.  A stone remaining in the common bile duct. The common bile duct carries bile from the gallbladder into the small intestine.  A bile leak from the cyst duct that is clipped when your gallbladder is removed. Medicines Ask your health care provider about:  Changing or stopping your regular medicines. This is  especially important if you are taking diabetes medicines or blood thinners.  Taking medicines such as aspirin and ibuprofen. These medicines can thin your blood. Do not take these medicines unless your health care provider tells you to take them.  Taking over-the-counter medicines, vitamins, herbs, and supplements. General instructions  Let your health care provider know if you develop a cold or an infection before surgery.  Plan to have someone take you home from the hospital or clinic.  If you will be going home right after the procedure, plan to have someone with you for 24 hours.  Ask your health care provider: ? How your surgery site will be marked. ? What steps will be taken to help prevent infection. These may include:  Removing hair at the surgery site.  Washing skin with a germ-killing soap.  Taking antibiotic medicine. What happens during the procedure?  An IV will be inserted into one of your veins.  You will be given one or both of the following: ? A medicine to help you relax (sedative). ? A medicine to make you fall asleep (general anesthetic).  A breathing tube will be placed in your mouth.  Your surgeon will make several small incisions in your abdomen.  The laparoscope will be inserted through one of the small incisions. The camera on the laparoscope will send images to a monitor in the operating room. This lets your surgeon see inside your abdomen.  A gas will be pumped into your abdomen. This will expand your abdomen to give the surgeon more room to perform the surgery.  Other tools that are needed for the procedure will be inserted through the other incisions. The gallbladder will   be removed through one of the incisions.  Your common bile duct may be examined. If stones are found in the common bile duct, they may be removed.  After your gallbladder has been removed, the incisions will be closed with stitches (sutures), staples, or skin glue.  Your  incisions may be covered with a bandage (dressing). The procedure may vary among health care providers and hospitals.   What happens after the procedure?  Your blood pressure, heart rate, breathing rate, and blood oxygen level will be monitored until you leave the hospital or clinic.  You will be given medicines as needed to control your pain.  If you were given a sedative during the procedure, it can affect you for several hours. Do not drive or operate machinery until your health care provider says that it is safe. Summary  Minimally invasive cholecystectomy, also called laparoscopic cholecystectomy, is surgery to remove the gallbladder using small incisions.  Tell your health care provider about all the medical conditions you have and all the medicines you are taking for those conditions.  Before the procedure, follow instructions about eating or drinking restrictions and changing or stopping medicines.  If you were given a sedative during the procedure, it can affect you for several hours. Do not drive or operate machinery until your health care provider says that it is safe. This information is not intended to replace advice given to you by your health care provider. Make sure you discuss any questions you have with your health care provider. Document Revised: 08/28/2019 Document Reviewed: 08/28/2019 Elsevier Patient Education  2021 [employer]  

## 2021-05-07 ENCOUNTER — Other Ambulatory Visit (HOSPITAL_COMMUNITY): Payer: BC Managed Care – PPO

## 2021-05-07 ENCOUNTER — Encounter (HOSPITAL_COMMUNITY): Admission: RE | Admit: 2021-05-07 | Payer: BC Managed Care – PPO | Source: Ambulatory Visit

## 2021-05-09 DIAGNOSIS — K8020 Calculus of gallbladder without cholecystitis without obstruction: Secondary | ICD-10-CM | POA: Diagnosis present

## 2021-05-23 NOTE — Patient Instructions (Signed)
Cassidy Gray  05/23/2021     @PREFPERIOPPHARMACY @   Your procedure is scheduled on 05/30/2021.   Report to 06/01/2021 at  [id number]  A.M.   Call this number if you have problems the morning of surgery:  [phone]   Remember:  Do not eat or drink after midnight.      Take these medicines the morning of surgery with A SIP OF WATER     None.     Please brush your teeth.  Do not wear jewelry, make-up or nail polish.  Do not wear lotions, powders, or perfumes, or deodorant.  Do not shave 48 hours prior to surgery.  Men may shave face and neck.  Do not bring valuables to the hospital.  [facility] is not responsible for any belongings or valuables.  Contacts, dentures or bridgework may not be worn into surgery.  Leave your suitcase in the car.  After surgery it may be brought to your room.  For patients admitted to the hospital, discharge time will be determined by your treatment team.  Patients discharged the day of surgery will not be allowed to drive home and must have someone with them for 24 hours.    Special instructions:  DO NOT smoke tobacco or vape for 24 hours before your procedure.  Please read over the following fact sheets that you were given. Coughing and Deep Breathing, Surgical Site Infection Prevention, Anesthesia Post-op Instructions, and Care and Recovery After Surgery      Minimally Invasive Cholecystectomy, Care After This sheet gives you information about how to care for yourself after your procedure. Your health care provider may also give you more specific instructions. If you have problems or questions, contact your health careprovider. What can I expect after the procedure? After the procedure, it is common to have: Pain at your incision sites. You will be given medicines to control this pain. Mild nausea or vomiting. Bloating and possible shoulder pain from the gas that was used during the procedure. Follow these instructions at  home: Medicines Take over-the-counter and prescription medicines only as told by your health care provider. If you were prescribed an antibiotic medicine, take or use it as told by your health care provider. Do not stop using the antibiotic even if you start to feel better. Ask your health care provider if the medicine prescribed to you: Requires you to avoid driving or using machinery. Can cause constipation. You may need to take these actions to prevent or treat constipation: Drink enough fluid to keep your urine pale yellow. Take over-the-counter or prescription medicines. Eat foods that are high in fiber, such as beans, whole grains, and fresh fruits and vegetables. Limit foods that are high in fat and processed sugars, such as fried or sweet foods. Incision care  Follow instructions from your health care provider about how to take care of your incisions. Make sure you: Wash your hands with soap and water for at least 20 seconds before and after you change your bandage (dressing). If soap and water are not available, use hand sanitizer. Change your dressing as told by your health care provider. Leave stitches (sutures), skin glue, or adhesive strips in place. These skin closures may need to be in place for 2 weeks or longer. If adhesive strip edges start to loosen and curl up, you may trim the loose edges. Do not remove adhesive strips completely unless your health care provider tells you to  do that. Do not take baths, swim, or use a hot tub until your health care provider approves. Ask your health care provider if you may take showers. You may only be allowed to take sponge baths. Check your incision area every day for signs of infection. Check for: More redness, swelling, or pain. Fluid or blood. Warmth. Pus or a bad smell.  Activity Rest as told by your health care provider. Avoid sitting for a long time without moving. Get up to take short walks every 1-2 hours. This is important to  improve blood flow and breathing. Ask for help if you feel weak or unsteady. Do not lift anything that is heavier than 10 lb (4.5 kg), or the limit that you are told, until your health care provider says that it is safe. Do not play contact sports until your health care provider approves. Do not return to work or school until your health care provider approves. Return to your normal activities as told by your health care provider. Ask your health care provider what activities are safe for you. General instructions If you were given a sedative during the procedure, it can affect you for several hours. Do not drive or operate machinery until your health care provider says that it is safe. Keep all follow-up visits as told by your health care provider. This is important. Contact a health care provider if: You develop a rash. You have more redness, swelling, or pain around your incisions. You have fluid or blood coming from your incisions. Your incisions feel warm to the touch. You have pus or a bad smell coming from your incisions. You have a fever. One or more of your incisions breaks open. Get help right away if: You have trouble breathing. You have chest pain. You have increasing pain in your shoulders. You faint or feel dizzy when you stand. You have severe pain in your abdomen. You have nausea or vomiting that lasts for more than one day. You have leg pain. Summary After your procedure, it is common to have pain at the incision sites. You may also have nausea or bloating. Follow your health care provider's instructions about medicine, activity restrictions, and caring for your incision areas. Do not do activities that require a lot of effort. Contact a health care provider if you have a fever or other signs of infection, such as more redness, swelling, or pain around the incisions. Get help right away if you have chest pain, increasing pain in the shoulders, or trouble breathing. This  information is not intended to replace advice given to you by your health care provider. Make sure you discuss any questions you have with your healthcare provider. Document Revised: 08/23/2019 Document Reviewed: 08/23/2019 Elsevier Patient Education  2022 [employer] General Anesthesia, Adult, Care After This sheet gives you information about how to care for yourself after your procedure. Your health care provider may also give you more specific instructions. If you have problems or questions, contact your health careprovider. What can I expect after the procedure? After the procedure, the following side effects are common: Pain or discomfort at the IV site. Nausea. Vomiting. Sore throat. Trouble concentrating. Feeling cold or chills. Feeling weak or tired. Sleepiness and fatigue. Soreness and body aches. These side effects can affect parts of the body that were not involved in surgery. Follow these instructions at home: For the time period you were told by your health care provider:  Rest. Do not participate in activities where you could  fall or become injured. Do not drive or use machinery. Do not drink alcohol. Do not take sleeping pills or medicines that cause drowsiness. Do not make important decisions or sign legal documents. Do not take care of children on your own.  Eating and drinking Follow any instructions from your health care provider about eating or drinking restrictions. When you feel hungry, start by eating small amounts of foods that are soft and easy to digest (bland), such as toast. Gradually return to your regular diet. Drink enough fluid to keep your urine pale yellow. If you vomit, rehydrate by drinking water, juice, or clear broth. General instructions If you have sleep apnea, surgery and certain medicines can increase your risk for breathing problems. Follow instructions from your health care provider about wearing your sleep device: Anytime you are  sleeping, including during daytime naps. While taking prescription pain medicines, sleeping medicines, or medicines that make you drowsy. Have a responsible adult stay with you for the time you are told. It is important to have someone help care for you until you are awake and alert. Return to your normal activities as told by your health care provider. Ask your health care provider what activities are safe for you. Take over-the-counter and prescription medicines only as told by your health care provider. If you smoke, do not smoke without supervision. Keep all follow-up visits as told by your health care provider. This is important. Contact a health care provider if: You have nausea or vomiting that does not get better with medicine. You cannot eat or drink without vomiting. You have pain that does not get better with medicine. You are unable to pass urine. You develop a skin rash. You have a fever. You have redness around your IV site that gets worse. Get help right away if: You have difficulty breathing. You have chest pain. You have blood in your urine or stool, or you vomit blood. Summary After the procedure, it is common to have a sore throat or nausea. It is also common to feel tired. Have a responsible adult stay with you for the time you are told. It is important to have someone help care for you until you are awake and alert. When you feel hungry, start by eating small amounts of foods that are soft and easy to digest (bland), such as toast. Gradually return to your regular diet. Drink enough fluid to keep your urine pale yellow. Return to your normal activities as told by your health care provider. Ask your health care provider what activities are safe for you. This information is not intended to replace advice given to you by your health care provider. Make sure you discuss any questions you have with your healthcare provider. Document Revised: 08/08/2020 Document Reviewed:  03/07/2020 Elsevier Patient Education  2022 Elsevier Inc. How to Use Chlorhexidine for Bathing Chlorhexidine gluconate (CHG) is a germ-killing (antiseptic) solution that is used to clean the skin. It can get rid of the bacteria that normally live on the skin and can keep them away for about 24 hours. To clean your skin with CHG, you may be given: A CHG solution to use in the shower or as part of a sponge bath. A prepackaged cloth that contains CHG. Cleaning your skin with CHG may help lower the risk for infection: While you are staying in the intensive care unit of the hospital. If you have a vascular access, such as a central line, to provide short-term or long-term access to your  veins. If you have a catheter to drain urine from your bladder. If you are on a ventilator. A ventilator is a machine that helps you breathe by moving air in and out of your lungs. After surgery. What are the risks? Risks of using CHG include: A skin reaction. Hearing loss, if CHG gets in your ears. Eye injury, if CHG gets in your eyes and is not rinsed out. The CHG product catching fire. Make sure that you avoid smoking and flames after applying CHG to your skin. Do not use CHG: If you have a chlorhexidine allergy or have previously reacted to chlorhexidine. On babies younger than 72 months of age. How to use CHG solution Use CHG only as told by your health care provider, and follow the instructions on the label. Use the full amount of CHG as directed. Usually, this is one bottle. During a shower Follow these steps when using CHG solution during a shower (unless your health care provider gives you different instructions): Start the shower. Use your normal soap and shampoo to wash your face and hair. Turn off the shower or move out of the shower stream. Pour the CHG onto a clean washcloth. Do not use any type of brush or rough-edged sponge. Starting at your neck, lather your body down to your toes. Make sure  you follow these instructions: If you will be having surgery, pay special attention to the part of your body where you will be having surgery. Scrub this area for at least 1 minute. Do not use CHG on your head or face. If the solution gets into your ears or eyes, rinse them well with water. Avoid your genital area. Avoid any areas of skin that have broken skin, cuts, or scrapes. Scrub your back and under your arms. Make sure to wash skin folds. Let the lather sit on your skin for 1-2 minutes or as long as told by your health care provider. Thoroughly rinse your entire body in the shower. Make sure that all body creases and crevices are rinsed well. Dry off with a clean towel. Do not put any substances on your body afterward--such as powder, lotion, or perfume--unless you are told to do so by your health care provider. Only use lotions that are recommended by the manufacturer. Put on clean clothes or pajamas. If it is the night before your surgery, sleep in clean sheets.  During a sponge bath Follow these steps when using CHG solution during a sponge bath (unless your health care provider gives you different instructions): Use your normal soap and shampoo to wash your face and hair. Pour the CHG onto a clean washcloth. Starting at your neck, lather your body down to your toes. Make sure you follow these instructions: If you will be having surgery, pay special attention to the part of your body where you will be having surgery. Scrub this area for at least 1 minute. Do not use CHG on your head or face. If the solution gets into your ears or eyes, rinse them well with water. Avoid your genital area. Avoid any areas of skin that have broken skin, cuts, or scrapes. Scrub your back and under your arms. Make sure to wash skin folds. Let the lather sit on your skin for 1-2 minutes or as long as told by your health care provider. Using a different clean, wet washcloth, thoroughly rinse your entire  body. Make sure that all body creases and crevices are rinsed well. Dry off with a clean  towel. Do not put any substances on your body afterward--such as powder, lotion, or perfume--unless you are told to do so by your health care provider. Only use lotions that are recommended by the manufacturer. Put on clean clothes or pajamas. If it is the night before your surgery, sleep in clean sheets. How to use CHG prepackaged cloths Only use CHG cloths as told by your health care provider, and follow the instructions on the label. Use the CHG cloth on clean, dry skin. Do not use the CHG cloth on your head or face unless your health care provider tells you to. When washing with the CHG cloth: Avoid your genital area. Avoid any areas of skin that have broken skin, cuts, or scrapes. Before surgery Follow these steps when using a CHG cloth to clean before surgery (unless your health care provider gives you different instructions): Using the CHG cloth, vigorously scrub the part of your body where you will be having surgery. Scrub using a back-and-forth motion for 3 minutes. The area on your body should be completely wet with CHG when you are done scrubbing. Do not rinse. Discard the cloth and let the area air-dry. Do not put any substances on the area afterward, such as powder, lotion, or perfume. Put on clean clothes or pajamas. If it is the night before your surgery, sleep in clean sheets.  For general bathing Follow these steps when using CHG cloths for general bathing (unless your health care provider gives you different instructions). Use a separate CHG cloth for each area of your body. Make sure you wash between any folds of skin and between your fingers and toes. Wash your body in the following order, switching to a new cloth after each step: The front of your neck, shoulders, and chest. Both of your arms, under your arms, and your hands. Your stomach and groin area, avoiding the genitals. Your  right leg and foot. Your left leg and foot. The back of your neck, your back, and your buttocks. Do not rinse. Discard the cloth and let the area air-dry. Do not put any substances on your body afterward--such as powder, lotion, or perfume--unless you are told to do so by your health care provider. Only use lotions that are recommended by the manufacturer. Put on clean clothes or pajamas. Contact a health care provider if: Your skin gets irritated after scrubbing. You have questions about using your solution or cloth. Get help right away if: Your eyes become very red or swollen. Your eyes itch badly. Your skin itches badly and is red or swollen. Your hearing changes. You have trouble seeing. You have swelling or tingling in your mouth or throat. You have trouble breathing. You swallow any chlorhexidine. Summary Chlorhexidine gluconate (CHG) is a germ-killing (antiseptic) solution that is used to clean the skin. Cleaning your skin with CHG may help to lower your risk for infection. You may be given CHG to use for bathing. It may be in a bottle or in a prepackaged cloth to use on your skin. Carefully follow your health care provider's instructions and the instructions on the product label. Do not use CHG if you have a chlorhexidine allergy. Contact your health care provider if your skin gets irritated after scrubbing. This information is not intended to replace advice given to you by your health care provider. Make sure you discuss any questions you have with your healthcare provider. Document Revised: 04/05/2020 Document Reviewed: 05/10/2020 Elsevier Patient Education  2022 [employer].

## 2021-05-27 ENCOUNTER — Encounter (HOSPITAL_COMMUNITY)
Admission: RE | Admit: 2021-05-27 | Discharge: 2021-05-27 | Disposition: A | Discharge status: Home / Self Care | Payer: BC Managed Care – PPO | Source: Ambulatory Visit | Attending: General Surgery | Admitting: General Surgery

## 2021-05-27 ENCOUNTER — Other Ambulatory Visit: Payer: Self-pay

## 2021-05-27 ENCOUNTER — Encounter (HOSPITAL_COMMUNITY): Payer: Self-pay

## 2021-05-30 ENCOUNTER — Ambulatory Visit (HOSPITAL_COMMUNITY): Payer: BC Managed Care – PPO | Admitting: Anesthesiology

## 2021-05-30 ENCOUNTER — Other Ambulatory Visit: Payer: Self-pay

## 2021-05-30 ENCOUNTER — Ambulatory Visit (HOSPITAL_COMMUNITY)
Admission: RE | Admit: 2021-05-30 | Discharge: 2021-05-30 | Disposition: A | Discharge status: Home / Self Care | Payer: BC Managed Care – PPO | Attending: General Surgery | Admitting: General Surgery

## 2021-05-30 ENCOUNTER — Encounter (HOSPITAL_COMMUNITY)
Admission: RE | Disposition: A | Discharge status: Home / Self Care | Payer: Self-pay | Source: Home / Self Care | Attending: General Surgery

## 2021-05-30 DIAGNOSIS — K8010 Calculus of gallbladder with chronic cholecystitis without obstruction: Secondary | ICD-10-CM | POA: Insufficient documentation

## 2021-05-30 DIAGNOSIS — Z87891 Personal history of nicotine dependence: Secondary | ICD-10-CM | POA: Diagnosis not present

## 2021-05-30 DIAGNOSIS — K8020 Calculus of gallbladder without cholecystitis without obstruction: Secondary | ICD-10-CM | POA: Diagnosis not present

## 2021-05-30 HISTORY — PX: CHOLECYSTECTOMY: SHX55

## 2021-05-30 SURGERY — LAPAROSCOPIC CHOLECYSTECTOMY
Anesthesia: General | Site: Abdomen

## 2021-05-30 MED ORDER — SUCCINYLCHOLINE CHLORIDE 20 MG/ML IJ SOLN
INTRAMUSCULAR | Status: DC | PRN
  Administered 2021-05-30: 140 mg via INTRAVENOUS

## 2021-05-30 MED ORDER — SUGAMMADEX SODIUM 200 MG/2ML IV SOLN
INTRAVENOUS | Status: DC | PRN
  Administered 2021-05-30: 200 mg via INTRAVENOUS

## 2021-05-30 MED ORDER — CHLORHEXIDINE GLUCONATE CLOTH 2 % EX PADS: 6.0000 | MEDICATED_PAD | Freq: Once | CUTANEOUS | Status: DC

## 2021-05-30 MED ORDER — LIDOCAINE HCL (CARDIAC) PF 100 MG/5ML IV SOSY
PREFILLED_SYRINGE | INTRAVENOUS | Status: DC | PRN
  Administered 2021-05-30: 100 mg via INTRAVENOUS

## 2021-05-30 MED ORDER — ROCURONIUM BROMIDE 10 MG/ML (PF) SYRINGE
PREFILLED_SYRINGE | INTRAVENOUS | Status: AC
  Filled 2021-05-30: qty 10

## 2021-05-30 MED ORDER — FENTANYL CITRATE (PF) 250 MCG/5ML IJ SOLN
INTRAMUSCULAR | Status: AC
  Filled 2021-05-30: qty 5

## 2021-05-30 MED ORDER — CHLORHEXIDINE GLUCONATE 0.12 % MT SOLN
15.0000 mL | Freq: Once | OROMUCOSAL | Status: AC
  Administered 2021-05-30: 15 mL via OROMUCOSAL

## 2021-05-30 MED ORDER — ONDANSETRON HCL 4 MG/2ML IJ SOLN
INTRAMUSCULAR | Status: AC
  Filled 2021-05-30: qty 4

## 2021-05-30 MED ORDER — PROPOFOL 10 MG/ML IV BOLUS
INTRAVENOUS | Status: AC
  Filled 2021-05-30: qty 40

## 2021-05-30 MED ORDER — ROCURONIUM BROMIDE 10 MG/ML (PF) SYRINGE
PREFILLED_SYRINGE | INTRAVENOUS | Status: DC | PRN
  Administered 2021-05-30: 40 mg via INTRAVENOUS

## 2021-05-30 MED ORDER — PROPOFOL 10 MG/ML IV BOLUS
INTRAVENOUS | Status: DC | PRN
  Administered 2021-05-30: 200 mg via INTRAVENOUS

## 2021-05-30 MED ORDER — OXYCODONE HCL 5 MG PO TABS: 5.0000 mg | ORAL_TABLET | ORAL | 0 refills | Status: AC | PRN

## 2021-05-30 MED ORDER — SODIUM CHLORIDE 0.9 % IV SOLN
INTRAVENOUS | Status: AC
  Filled 2021-05-30 (×2): qty 1

## 2021-05-30 MED ORDER — BUPIVACAINE HCL (PF) 0.5 % IJ SOLN
INTRAMUSCULAR | Status: DC | PRN
  Administered 2021-05-30: 10 mL

## 2021-05-30 MED ORDER — LIDOCAINE HCL (PF) 2 % IJ SOLN
INTRAMUSCULAR | Status: AC
  Filled 2021-05-30: qty 5

## 2021-05-30 MED ORDER — CEFOTETAN DISODIUM 2 G IJ SOLR
2.0000 g | INTRAMUSCULAR | Status: AC
  Administered 2021-05-30: 2 g via INTRAVENOUS

## 2021-05-30 MED ORDER — ONDANSETRON HCL 4 MG/2ML IJ SOLN
INTRAMUSCULAR | Status: DC | PRN
  Administered 2021-05-30: 4 mg via INTRAVENOUS

## 2021-05-30 MED ORDER — LACTATED RINGERS IV SOLN: INTRAVENOUS | Status: DC

## 2021-05-30 MED ORDER — BUPIVACAINE HCL (PF) 0.5 % IJ SOLN
INTRAMUSCULAR | Status: AC
  Filled 2021-05-30: qty 30

## 2021-05-30 MED ORDER — SODIUM CHLORIDE 0.9 % IR SOLN
Status: DC | PRN
  Administered 2021-05-30: 1000 mL

## 2021-05-30 MED ORDER — MIDAZOLAM HCL 5 MG/5ML IJ SOLN
INTRAMUSCULAR | Status: DC | PRN
  Administered 2021-05-30: 2 mg via INTRAVENOUS

## 2021-05-30 MED ORDER — ONDANSETRON HCL 4 MG/2ML IJ SOLN: 4.0000 mg | Freq: Once | INTRAMUSCULAR | Status: DC | PRN

## 2021-05-30 MED ORDER — MIDAZOLAM HCL 2 MG/2ML IJ SOLN
INTRAMUSCULAR | Status: AC
  Filled 2021-05-30: qty 2

## 2021-05-30 MED ORDER — MEPERIDINE HCL 50 MG/ML IJ SOLN: 6.2500 mg | INTRAMUSCULAR | Status: DC | PRN

## 2021-05-30 MED ORDER — ONDANSETRON HCL 4 MG PO TABS: 4.0000 mg | ORAL_TABLET | Freq: Three times a day (TID) | ORAL | 1 refills | Status: AC | PRN

## 2021-05-30 MED ORDER — HYDROMORPHONE HCL 1 MG/ML IJ SOLN: 0.2500 mg | INTRAMUSCULAR | Status: DC | PRN

## 2021-05-30 MED ORDER — FENTANYL CITRATE (PF) 100 MCG/2ML IJ SOLN
INTRAMUSCULAR | Status: DC | PRN
  Administered 2021-05-30: 50 ug via INTRAVENOUS
  Administered 2021-05-30: 150 ug via INTRAVENOUS
  Administered 2021-05-30: 50 ug via INTRAVENOUS

## 2021-05-30 MED ORDER — SUCCINYLCHOLINE CHLORIDE 200 MG/10ML IV SOSY
PREFILLED_SYRINGE | INTRAVENOUS | Status: AC
  Filled 2021-05-30: qty 10

## 2021-05-30 MED ORDER — SCOPOLAMINE 1 MG/3DAYS TD PT72
1.0000 | MEDICATED_PATCH | Freq: Once | TRANSDERMAL | Status: DC
  Administered 2021-05-30: 1.5 mg via TRANSDERMAL
  Filled 2021-05-30: qty 1

## 2021-05-30 MED ORDER — DEXAMETHASONE SODIUM PHOSPHATE 4 MG/ML IJ SOLN
INTRAMUSCULAR | Status: DC | PRN
  Administered 2021-05-30: 8 mg via INTRAVENOUS

## 2021-05-30 MED ORDER — CHLORHEXIDINE GLUCONATE 0.12 % MT SOLN
OROMUCOSAL | Status: AC
  Filled 2021-05-30: qty 15

## 2021-05-30 MED ORDER — HEMOSTATIC AGENTS (NO CHARGE) OPTIME
TOPICAL | Status: DC | PRN
  Administered 2021-05-30: 1 via TOPICAL

## 2021-05-30 MED ORDER — ORAL CARE MOUTH RINSE: 15.0000 mL | Freq: Once | OROMUCOSAL | Status: AC

## 2021-05-30 SURGICAL SUPPLY — 43 items
ADH SKN CLS APL DERMABOND .7 (GAUZE/BANDAGES/DRESSINGS) ×1
APL PRP STRL LF DISP 70% ISPRP (MISCELLANEOUS) ×1
APPLIER CLIP ROT 10 11.4 M/L (STAPLE) ×3
APR CLP MED LRG 11.4X10 (STAPLE) ×1
BAG RETRIEVAL 10 (BASKET) ×1
BAG RETRIEVAL 10MM (BASKET) ×1
BLADE SURG 15 STRL LF DISP TIS (BLADE) ×1 IMPLANT
BLADE SURG 15 STRL SS (BLADE) ×3
CHLORAPREP W/TINT 26 (MISCELLANEOUS) ×3 IMPLANT
CLIP APPLIE ROT 10 11.4 M/L (STAPLE) ×1 IMPLANT
CLOTH BEACON ORANGE TIMEOUT ST (SAFETY) ×3 IMPLANT
COVER LIGHT HANDLE STERIS (MISCELLANEOUS) ×6 IMPLANT
COVER WAND RF STERILE (DRAPES) ×3 IMPLANT
DECANTER SPIKE VIAL GLASS SM (MISCELLANEOUS) ×3 IMPLANT
DERMABOND ADVANCED (GAUZE/BANDAGES/DRESSINGS) ×2
DERMABOND ADVANCED .7 DNX12 (GAUZE/BANDAGES/DRESSINGS) ×1 IMPLANT
ELECT REM PT RETURN 9FT ADLT (ELECTROSURGICAL) ×3
ELECTRODE REM PT RTRN 9FT ADLT (ELECTROSURGICAL) ×1 IMPLANT
GLOVE SURG ENC MOIS LTX SZ6.5 (GLOVE) ×3 IMPLANT
GLOVE SURG UNDER POLY LF SZ6.5 (GLOVE) ×3 IMPLANT
GLOVE SURG UNDER POLY LF SZ7 (GLOVE) ×9 IMPLANT
GOWN STRL REUS W/TWL LRG LVL3 (GOWN DISPOSABLE) ×9 IMPLANT
HEMOSTAT SNOW SURGICEL 2X4 (HEMOSTASIS) ×3 IMPLANT
INST SET LAPROSCOPIC AP (KITS) ×3 IMPLANT
KIT TURNOVER KIT A (KITS) ×3 IMPLANT
MANIFOLD NEPTUNE II (INSTRUMENTS) ×3 IMPLANT
NEEDLE INSUFFLATION 14GA 120MM (NEEDLE) ×3 IMPLANT
NS IRRIG 1000ML POUR BTL (IV SOLUTION) ×3 IMPLANT
PACK LAP CHOLE LZT030E (CUSTOM PROCEDURE TRAY) ×3 IMPLANT
PAD ARMBOARD 7.5X6 YLW CONV (MISCELLANEOUS) ×3 IMPLANT
SET BASIN LINEN APH (SET/KITS/TRAYS/PACK) ×3 IMPLANT
SET TUBE SMOKE EVAC HIGH FLOW (TUBING) ×3 IMPLANT
SLEEVE ENDOPATH XCEL 5M (ENDOMECHANICALS) ×3 IMPLANT
SUT MNCRL AB 4-0 PS2 18 (SUTURE) ×6 IMPLANT
SUT VICRYL 0 UR6 27IN ABS (SUTURE) ×3 IMPLANT
SYS BAG RETRIEVAL 10MM (BASKET) ×1
SYSTEM BAG RETRIEVAL 10MM (BASKET) ×1 IMPLANT
TROCAR ENDO BLADELESS 11MM (ENDOMECHANICALS) ×3 IMPLANT
TROCAR XCEL NON-BLD 5MMX100MML (ENDOMECHANICALS) ×3 IMPLANT
TROCAR XCEL UNIV SLVE 11M 100M (ENDOMECHANICALS) ×3 IMPLANT
TUBE CONNECTING 12'X1/4 (SUCTIONS) ×1
TUBE CONNECTING 12X1/4 (SUCTIONS) ×2 IMPLANT
WARMER LAPAROSCOPE (MISCELLANEOUS) ×3 IMPLANT

## 2021-05-30 NOTE — Anesthesia Postprocedure Evaluation (Signed)
Anesthesia Post Note  Patient: Cassidy Gray  Procedure(s) Performed: LAPAROSCOPIC CHOLECYSTECTOMY (Abdomen)  Patient location during evaluation: Phase II Anesthesia Type: General Level of consciousness: awake and alert and oriented Pain management: pain level controlled Vital Signs Assessment: post-procedure vital signs reviewed and stable Respiratory status: spontaneous breathing and respiratory function stable Cardiovascular status: blood pressure returned to baseline and stable Postop Assessment: no apparent nausea or vomiting Anesthetic complications: no   No notable events documented.   Last Vitals:  Vitals:   05/30/21 0845 05/30/21 0900  BP: (!) 161/90 (!) 142/87  Pulse: 68 69  Resp: (!) 22 20  Temp:  36.4 C  SpO2: 96% 94%    Last Pain:  Vitals:   05/30/21 0900  TempSrc: Axillary  PainSc: 1                  Saba Gomm C Kemonte Ullman

## 2021-05-30 NOTE — Op Note (Signed)
Operative Note   Preoperative Diagnosis: Symptomatic cholelithiasis   Postoperative Diagnosis: Same   Procedure(s) Performed: Laparoscopic cholecystectomy   Surgeon: Lillia Abed C. Henreitta Leber, MD   Assistants: No qualified resident was available   Anesthesia: General endotracheal   Anesthesiologist: Molli Barrows, MD    Specimens: Gallbladder    Estimated Blood Loss: Minimal    Blood Replacement: None    Complications: None    Operative Findings:  Distended gallbladder with stones    Procedure: The patient was taken to the operating room and placed supine. General endotracheal anesthesia was induced. Intravenous antibiotics were administered per protocol. An orogastric tube positioned to decompress the stomach. The abdomen was prepared and draped in the usual sterile fashion.    A supraumbilical incision was made and a Veress technique was utilized to achieve pneumoperitoneum to 15 mmHg with carbon dioxide. A 11 mm optiview port was placed through the supraumbilical region, and a 10 mm 0-degree operative laparoscope was introduced. The area underlying the trocar and Veress needle were inspected and without evidence of injury.  Remaining trocars were placed under direct vision. Two 5 mm ports were placed in the right abdomen, between the anterior axillary and midclavicular line.  A final 11 mm port was placed through the mid-epigastrium, near the falciform ligament.    The gallbladder fundus was elevated cephalad and the infundibulum was retracted to the patient's right. The gallbladder/cystic duct junction was skeletonized. The cystic artery noted in the triangle of Calot and was also skeletonized.  We then continued liberal medial and lateral dissection until the critical view of safety was achieved.    The cystic duct was triply clipped and divided. The cystic artery was doubly clipped and divided and a small posterior vessel <64mm branch was doubly clipped and divided.  The  gallbladder was then dissected from the liver bed with electrocautery. The specimen was placed in an Endopouch and was retrieved through the epigastric site.   Final inspection revealed acceptable hemostasis. Surgical SNOW was placed in the gallbladder bed.  Trocars were removed and pneumoperitoneum was released.  0 Vicryl fascial sutures were used to close the epigastric and umbilical port sites. Skin incisions were closed with 4-0 Monocryl subcuticular sutures and Dermabond. The patient was awakened from anesthesia and extubated without complication.    Algis Greenhouse, MD [facility] [address] [place], [place] [address] [phone] (office)

## 2021-05-30 NOTE — Transfer of Care (Signed)
Immediate Anesthesia Transfer of Care Note  Patient: Cassidy Gray  Procedure(s) Performed: LAPAROSCOPIC CHOLECYSTECTOMY (Abdomen)  Patient Location: PACU  Anesthesia Type:General  Level of Consciousness: awake, alert , oriented and patient cooperative  Airway & Oxygen Therapy: Patient Spontanous Breathing  Post-op Assessment: Report given to RN, Post -op Vital signs reviewed and stable and Patient moving all extremities  Post vital signs: Reviewed and stable  Last Vitals:  Vitals Value Taken Time  BP    Temp    Pulse 69 05/30/21 0829  Resp 12 05/30/21 0829  SpO2 90 % 05/30/21 0829  Vitals shown include unvalidated device data.  Last Pain:  Vitals:   05/30/21 0644  TempSrc: Oral  PainSc: 0-No pain      Patients Stated Pain Goal: 8 (05/30/21 9211)  Complications: No notable events documented.

## 2021-05-30 NOTE — H&P (Signed)
[facility] History and Physical  Reason for Referral:Gallstones  Referring Physician:  Burdine    Cassidy Gray is a 55 y.o. female.  HPI: I saw the patient several weeks ago and she was having RUQ pain and nausea/vomiting. She had been having it for a year and it was getting worse. She continues to have symptoms. She has changed her diet to help.  She is otherwise healthy. She works at [employer] in the office.  No other medical changes since I saw her last.     No past medical history on file.  Past Surgical History:  Procedure Laterality Date   COLONOSCOPY N/A 06/17/2017   Procedure: COLONOSCOPY;  Surgeon: Malissa Hippo, MD;  Location: [facility];  Service: Endoscopy;  Laterality: N/A;  830    Family History  Problem Relation Age of Onset   Osteoporosis Mother    Lung cancer Father    Melanoma Brother     Social History   Tobacco Use   Smoking status: Former    Packs/day: 0.50    Years: 20.00    Pack years: 10.00    Types: Cigarettes    Quit date: 05/07/2010    Years since quitting: 11.0   Smokeless tobacco: Never   Tobacco comments:    quit 2011  Vaping Use   Vaping Use: Never used  Substance Use Topics   Alcohol use: Yes    Alcohol/week: 4.0 standard drinks    Types: 4 Cans of beer per week    Comment: couple of beers 2-3 times per week   Drug use: No    Medications: I have reviewed the patient's current medications. Medications Prior to Admission  Medication Sig Dispense Refill   ibuprofen (ADVIL) 200 MG tablet Take 400 mg by mouth every 6 (six) hours as needed for mild pain, moderate pain or headache.     Multiple Vitamin (MULTIVITAMIN) capsule Take 1 capsule by mouth daily. Alive for Woman      No Known Allergies    ROS:  A comprehensive review of systems was negative except for: Gastrointestinal: positive for abdominal pain and nausea  Blood pressure 138/84, pulse 72, temperature 98.5 F (36.9 C), temperature source Oral,  resp. rate 18, height 5\' 4"  (1.626 m), weight 107 kg, last menstrual period 09/06/2013, SpO2 96 %. Physical Exam Vitals reviewed.  HENT:     Head: Normocephalic.     Mouth/Throat:     Mouth: Mucous membranes are moist.  Eyes:     Pupils: Pupils are equal, round, and reactive to light.  Cardiovascular:     Rate and Rhythm: Normal rate.  Pulmonary:     Effort: Pulmonary effort is normal.  Abdominal:     General: There is no distension.     Palpations: Abdomen is soft.     Tenderness: There is abdominal tenderness.  Skin:    General: Skin is warm.  Neurological:     General: No focal deficit present.     Mental Status: She is alert.  Psychiatric:        Mood and Affect: Mood normal.        Behavior: Behavior normal.    Results: No results found for this or any previous visit (from the past 48 hour(s)).  No results found.   Assessment & Plan:  Cassidy Gray is a 55 y.o. female with gallstones. Plans for cholecystectomy today. Discussed the procedure at our last visit and risk.    All questions were answered  to the satisfaction of the patient.   Lucretia Roers 05/30/2021, 7:31 AM

## 2021-05-30 NOTE — Anesthesia Preprocedure Evaluation (Addendum)
Anesthesia Evaluation  Patient identified by MRN, date of birth, ID band Patient awake    Reviewed: Allergy & Precautions, NPO status , Patient's Chart, lab work & pertinent test results  History of Anesthesia Complications Negative for: history of anesthetic complications  Airway Mallampati: II  TM Distance: >3 FB Neck ROM: Full    Dental  (+) Dental Advisory Given Crown :   Pulmonary former smoker,    Pulmonary exam normal breath sounds clear to auscultation       Cardiovascular Exercise Tolerance: Good Normal cardiovascular exam Rhythm:Regular Rate:Normal     Neuro/Psych negative neurological ROS  negative psych ROS   GI/Hepatic negative GI ROS, (+)     substance abuse  alcohol use,   Endo/Other  Morbid obesity  Renal/GU negative Renal ROS     Musculoskeletal   Abdominal   Peds  Hematology negative hematology ROS (+)   Anesthesia Other Findings   Reproductive/Obstetrics                          Anesthesia Physical Anesthesia Plan  ASA: 2  Anesthesia Plan: General   Post-op Pain Management:    Induction: Intravenous  PONV Risk Score and Plan: 4 or greater and Ondansetron, Dexamethasone, Midazolam and Scopolamine patch - Pre-op  Airway Management Planned: Oral ETT  Additional Equipment:   Intra-op Plan:   Post-operative Plan: Extubation in OR  Informed Consent: I have reviewed the patients History and Physical, chart, labs and discussed the procedure including the risks, benefits and alternatives for the proposed anesthesia with the patient or authorized representative who has indicated his/her understanding and acceptance.     Dental advisory given  Plan Discussed with: CRNA and Surgeon  Anesthesia Plan Comments:       Anesthesia Quick Evaluation

## 2021-05-30 NOTE — Interval H&P Note (Signed)
History and Physical Interval Note:  05/30/2021 7:29 AM  Cassidy Gray  has presented today for surgery, with the diagnosis of Cholelithiasis.  The various methods of treatment have been discussed with the patient and family. After consideration of risks, benefits and other options for treatment, the patient has consented to  Procedure(s): LAPAROSCOPIC CHOLECYSTECTOMY (N/A) as a surgical intervention.  The patient's history has been reviewed, patient examined, no change in status, stable for surgery.  I have reviewed the patient's chart and labs.  Questions were answered to the patient's satisfaction.     Lucretia Roers

## 2021-05-30 NOTE — Anesthesia Procedure Notes (Signed)
Procedure Name: Intubation Date/Time: 05/30/2021 7:38 AM Performed by: Brynda Peon, CRNA Pre-anesthesia Checklist: Patient identified, Emergency Drugs available, Suction available, Patient being monitored and Timeout performed Patient Re-evaluated:Patient Re-evaluated prior to induction Oxygen Delivery Method: Circle system utilized Preoxygenation: Pre-oxygenation with 100% oxygen Induction Type: IV induction Laryngoscope Size: Miller and 2 Grade View: Grade I Tube type: Oral Tube size: 7.0 mm Number of attempts: 1 Airway Equipment and Method: Stylet Placement Confirmation: ETT inserted through vocal cords under direct vision, positive ETCO2, CO2 detector and breath sounds checked- equal and bilateral Secured at: 21 cm Tube secured with: Tape Dental Injury: Teeth and Oropharynx as per pre-operative assessment

## 2021-05-30 NOTE — Discharge Instructions (Signed)
Discharge Laparoscopic Surgery Instructions:  Common Complaints: Right shoulder pain is common after laparoscopic surgery. This is secondary to the gas used in the surgery being trapped under the diaphragm.  Walk to help your body absorb the gas. This will improve in a few days. Pain at the port sites are common, especially the larger port sites. This will improve with time.  Some nausea is common and poor appetite. The main goal is to stay hydrated the first few days after surgery.   Diet/ Activity: Diet as tolerated. You may not have an appetite, but it is important to stay hydrated. Drink 64 ounces of water a day. Your appetite will return with time.  Shower per your regular routine daily.  Do not take hot showers. Take warm showers that are less than 10 minutes. Rest and listen to your body, but do not remain in bed all day.  Walk everyday for at least 15-20 minutes. Deep cough and move around every 1-2 hours in the first few days after surgery.  Do not lift > 10 lbs, perform excessive bending, pushing, pulling, squatting for 1-2 weeks after surgery.  Do not pick at the dermabond glue on your incision sites.  This glue film will remain in place for 1-2 weeks and will start to peel off.  Do not place lotions or balms on your incision unless instructed to specifically by Dr. Casson Catena.   Pain Expectations and Narcotics: -After surgery you will have pain associated with your incisions and this is normal. The pain is muscular and nerve pain, and will get better with time. -You are encouraged and expected to take non narcotic medications like tylenol and ibuprofen (when able) to treat pain as multiple modalities can aid with pain treatment. -Narcotics are only used when pain is severe or there is breakthrough pain. -You are not expected to have a pain score of 0 after surgery, as we cannot prevent pain. A pain score of 3-4 that allows you to be functional, move, walk, and tolerate some activity is  the goal. The pain will continue to improve over the days after surgery and is dependent on your surgery. -Due to [place] law, we are only able to give a certain amount of pain medication to treat post operative pain, and we only give additional narcotics on a patient by patient basis.  -For most laparoscopic surgery, studies have shown that the majority of patients only need 10-15 narcotic pills, and for open surgeries most patients only need 15-20.   -Having appropriate expectations of pain and knowledge of pain management with non narcotics is important as we do not want anyone to become addicted to narcotic pain medication.  -Using ice packs in the first 48 hours and heating pads after 48 hours, wearing an abdominal binder (when recommended), and using over the counter medications are all ways to help with pain management.   -Simple acts like meditation and mindfulness practices after surgery can also help with pain control and research has proven the benefit of these practices.  Medication: Take tylenol and ibuprofen as needed for pain control, alternating every 4-6 hours.  Example:  Tylenol 1000mg @ 6am, 12noon, 6pm, 12midnight (Do not exceed 4000mg of tylenol a day). Ibuprofen 800mg @ 9am, 3pm, 9pm, 3am (Do not exceed 3600mg of ibuprofen a day).  Take Roxicodone for breakthrough pain every 4 hours.  Take Colace for constipation related to narcotic pain medication. If you do not have a bowel movement in 2 days, take Miralax   over the counter.  Drink plenty of water to also prevent constipation.   Contact Information: If you have questions or concerns, please call our office, [phone], Monday- Thursday 8AM-5PM and Friday 8AM-12Noon.  If it is after hours or on the weekend, please call [facility], [phone], [phone], and ask to speak to the surgeon on call for Dr. Fields Oros at [place].   

## 2021-05-30 NOTE — Progress Notes (Signed)
[facility]  Updated husband about surgery. Rx sent to [facility]. Post op phone call 7/5.   Can return to work 6/28 and if needs additional time, can call the office.  Algis Greenhouse, MD [facility] [address] [place], [place] [address] [phone] (office)

## 2021-06-02 ENCOUNTER — Encounter (HOSPITAL_COMMUNITY): Payer: Self-pay | Admitting: General Surgery

## 2021-06-02 LAB — SURGICAL PATHOLOGY

## 2021-06-10 ENCOUNTER — Ambulatory Visit (INDEPENDENT_AMBULATORY_CARE_PROVIDER_SITE_OTHER): Payer: BC Managed Care – PPO | Admitting: General Surgery

## 2021-06-10 DIAGNOSIS — K8020 Calculus of gallbladder without cholecystitis without obstruction: Secondary | ICD-10-CM

## 2021-06-10 NOTE — Progress Notes (Signed)
[facility]  I am calling the patient for post operative evaluation. This is not a billable encounter as it is under the global charges for the surgery.  The patient had a laparoscopic cholecystectomy on 05/30/2021. I left a voicemail with the pathology. Activity and diet as tolerated and her to contact the office with questions or concerns.   Pathology: FINAL MICROSCOPIC DIAGNOSIS:   A. GALLBLADDER, CHOLECYSTECTOMY:  - Chronic calculous cholecystitis.  No dysplasia or malignancy.   Will see the patient PRN.   Algis Greenhouse, MD [facility] [address] [place], [place] [address] [phone] (office)

## 2022-02-24 ENCOUNTER — Other Ambulatory Visit (HOSPITAL_COMMUNITY): Payer: Self-pay | Admitting: Family Medicine

## 2022-02-24 DIAGNOSIS — Z1231 Encounter for screening mammogram for malignant neoplasm of breast: Secondary | ICD-10-CM

## 2022-03-02 ENCOUNTER — Other Ambulatory Visit: Payer: Self-pay

## 2022-03-02 ENCOUNTER — Ambulatory Visit (HOSPITAL_COMMUNITY)
Admission: RE | Admit: 2022-03-02 | Discharge: 2022-03-02 | Disposition: A | Discharge status: Home / Self Care | Payer: BC Managed Care – PPO | Source: Ambulatory Visit | Attending: Family Medicine | Admitting: Family Medicine

## 2022-03-02 DIAGNOSIS — Z1231 Encounter for screening mammogram for malignant neoplasm of breast: Secondary | ICD-10-CM | POA: Diagnosis not present

## 2023-04-30 ENCOUNTER — Other Ambulatory Visit (HOSPITAL_COMMUNITY): Payer: Self-pay | Admitting: Family Medicine

## 2023-04-30 DIAGNOSIS — Z1231 Encounter for screening mammogram for malignant neoplasm of breast: Secondary | ICD-10-CM

## 2023-05-10 ENCOUNTER — Ambulatory Visit (HOSPITAL_COMMUNITY): Payer: BC Managed Care – PPO

## 2023-05-19 ENCOUNTER — Ambulatory Visit (HOSPITAL_COMMUNITY): Payer: BC Managed Care – PPO

## 2023-11-18 ENCOUNTER — Other Ambulatory Visit (HOSPITAL_COMMUNITY): Payer: Self-pay | Admitting: Family Medicine

## 2023-11-18 DIAGNOSIS — Z1231 Encounter for screening mammogram for malignant neoplasm of breast: Secondary | ICD-10-CM

## 2024-01-12 ENCOUNTER — Encounter (HOSPITAL_COMMUNITY): Payer: Self-pay

## 2024-01-12 ENCOUNTER — Ambulatory Visit (HOSPITAL_COMMUNITY)
Admission: RE | Admit: 2024-01-12 | Discharge: 2024-01-12 | Disposition: A | Discharge status: Home / Self Care | Payer: BC Managed Care – PPO | Source: Ambulatory Visit | Attending: Family Medicine | Admitting: Family Medicine

## 2024-01-12 DIAGNOSIS — Z1231 Encounter for screening mammogram for malignant neoplasm of breast: Secondary | ICD-10-CM | POA: Insufficient documentation
